# Patient Record
Sex: Female | Born: 1960
Health system: Southern US, Community
[De-identification: ages and names within clinical notes are randomized; demographics above are authoritative.]

## PROBLEM LIST (undated history)

## (undated) DIAGNOSIS — E119 Type 2 diabetes mellitus without complications: Secondary | ICD-10-CM

## (undated) DIAGNOSIS — Z8719 Personal history of other diseases of the digestive system: Secondary | ICD-10-CM

## (undated) DIAGNOSIS — Z8619 Personal history of other infectious and parasitic diseases: Secondary | ICD-10-CM

## (undated) DIAGNOSIS — E7800 Pure hypercholesterolemia, unspecified: Secondary | ICD-10-CM

## (undated) DIAGNOSIS — Z720 Tobacco use: Secondary | ICD-10-CM

## (undated) DIAGNOSIS — K219 Gastro-esophageal reflux disease without esophagitis: Secondary | ICD-10-CM

## (undated) DIAGNOSIS — E669 Obesity, unspecified: Secondary | ICD-10-CM

## (undated) DIAGNOSIS — M1990 Unspecified osteoarthritis, unspecified site: Secondary | ICD-10-CM

## (undated) DIAGNOSIS — I10 Essential (primary) hypertension: Secondary | ICD-10-CM

## (undated) HISTORY — PX: CHOLECYSTECTOMY: SHX55

## (undated) HISTORY — DX: Personal history of other infectious and parasitic diseases: Z86.19

## (undated) HISTORY — PX: ABDOMINAL HYSTERECTOMY: SHX81

## (undated) HISTORY — PX: APPENDECTOMY: SHX54

## (undated) HISTORY — PX: OTHER SURGICAL HISTORY: SHX169

---

## 2004-02-29 HISTORY — PX: CHOLECYSTECTOMY: SHX55

## 2004-02-29 HISTORY — PX: APPENDECTOMY: SHX54

## 2007-03-01 HISTORY — PX: ABDOMINAL HYSTERECTOMY: SHX81

## 2015-10-30 LAB — HM COLONOSCOPY

## 2015-11-29 LAB — HM COLONOSCOPY

## 2016-07-11 ENCOUNTER — Observation Stay (HOSPITAL_BASED_OUTPATIENT_CLINIC_OR_DEPARTMENT_OTHER)
Admission: EM | Admit: 2016-07-11 | Discharge: 2016-07-12 | Disposition: A | Discharge status: Home / Self Care | Payer: BLUE CROSS/BLUE SHIELD | Attending: Internal Medicine | Admitting: Internal Medicine

## 2016-07-11 ENCOUNTER — Encounter (HOSPITAL_BASED_OUTPATIENT_CLINIC_OR_DEPARTMENT_OTHER): Payer: Self-pay

## 2016-07-11 ENCOUNTER — Emergency Department (HOSPITAL_BASED_OUTPATIENT_CLINIC_OR_DEPARTMENT_OTHER): Payer: BLUE CROSS/BLUE SHIELD

## 2016-07-11 DIAGNOSIS — E7800 Pure hypercholesterolemia, unspecified: Secondary | ICD-10-CM | POA: Insufficient documentation

## 2016-07-11 DIAGNOSIS — I10 Essential (primary) hypertension: Secondary | ICD-10-CM | POA: Diagnosis present

## 2016-07-11 DIAGNOSIS — Z6834 Body mass index (BMI) 34.0-34.9, adult: Secondary | ICD-10-CM | POA: Diagnosis not present

## 2016-07-11 DIAGNOSIS — E1169 Type 2 diabetes mellitus with other specified complication: Secondary | ICD-10-CM

## 2016-07-11 DIAGNOSIS — E119 Type 2 diabetes mellitus without complications: Secondary | ICD-10-CM | POA: Diagnosis not present

## 2016-07-11 DIAGNOSIS — Z79899 Other long term (current) drug therapy: Secondary | ICD-10-CM | POA: Insufficient documentation

## 2016-07-11 DIAGNOSIS — R079 Chest pain, unspecified: Secondary | ICD-10-CM | POA: Diagnosis present

## 2016-07-11 DIAGNOSIS — Z794 Long term (current) use of insulin: Secondary | ICD-10-CM | POA: Diagnosis not present

## 2016-07-11 DIAGNOSIS — E669 Obesity, unspecified: Secondary | ICD-10-CM | POA: Diagnosis not present

## 2016-07-11 DIAGNOSIS — I200 Unstable angina: Principal | ICD-10-CM | POA: Insufficient documentation

## 2016-07-11 DIAGNOSIS — F17210 Nicotine dependence, cigarettes, uncomplicated: Secondary | ICD-10-CM | POA: Insufficient documentation

## 2016-07-11 DIAGNOSIS — Z7982 Long term (current) use of aspirin: Secondary | ICD-10-CM | POA: Diagnosis not present

## 2016-07-11 HISTORY — DX: Obesity, unspecified: E66.9

## 2016-07-11 HISTORY — DX: Pure hypercholesterolemia, unspecified: E78.00

## 2016-07-11 HISTORY — DX: Type 2 diabetes mellitus without complications: E11.9

## 2016-07-11 HISTORY — DX: Tobacco use: Z72.0

## 2016-07-11 HISTORY — DX: Essential (primary) hypertension: I10

## 2016-07-11 LAB — CBC WITH DIFFERENTIAL/PLATELET
BASOS ABS: 0 10*3/uL (ref 0.0–0.1)
Basophils Relative: 0 %
Eosinophils Absolute: 0.2 10*3/uL (ref 0.0–0.7)
Eosinophils Relative: 2 %
HEMATOCRIT: 42.5 % (ref 36.0–46.0)
HEMOGLOBIN: 14.2 g/dL (ref 12.0–15.0)
LYMPHS ABS: 2 10*3/uL (ref 0.7–4.0)
LYMPHS PCT: 30 %
MCH: 30.7 pg (ref 26.0–34.0)
MCHC: 33.4 g/dL (ref 30.0–36.0)
MCV: 91.8 fL (ref 78.0–100.0)
Monocytes Absolute: 0.4 10*3/uL (ref 0.1–1.0)
Monocytes Relative: 6 %
NEUTROS ABS: 4 10*3/uL (ref 1.7–7.7)
NEUTROS PCT: 62 %
PLATELETS: 225 10*3/uL (ref 150–400)
RBC: 4.63 MIL/uL (ref 3.87–5.11)
RDW: 14 % (ref 11.5–15.5)
WBC: 6.7 10*3/uL (ref 4.0–10.5)

## 2016-07-11 LAB — TROPONIN I

## 2016-07-11 LAB — COMPREHENSIVE METABOLIC PANEL
ALK PHOS: 158 U/L — AB (ref 38–126)
ALT: 34 U/L (ref 14–54)
AST: 72 U/L — AB (ref 15–41)
Albumin: 3.9 g/dL (ref 3.5–5.0)
Anion gap: 8 (ref 5–15)
BILIRUBIN TOTAL: 0.4 mg/dL (ref 0.3–1.2)
BUN: 11 mg/dL (ref 6–20)
CALCIUM: 9.2 mg/dL (ref 8.9–10.3)
CHLORIDE: 103 mmol/L (ref 101–111)
CO2: 26 mmol/L (ref 22–32)
CREATININE: 0.82 mg/dL (ref 0.44–1.00)
Glucose, Bld: 182 mg/dL — ABNORMAL HIGH (ref 65–99)
Potassium: 4.3 mmol/L (ref 3.5–5.1)
Sodium: 137 mmol/L (ref 135–145)
Total Protein: 7 g/dL (ref 6.5–8.1)

## 2016-07-11 LAB — GLUCOSE, CAPILLARY: Glucose-Capillary: 175 mg/dL — ABNORMAL HIGH (ref 65–99)

## 2016-07-11 MED ORDER — LABETALOL HCL 5 MG/ML IV SOLN: 5.0000 mg | INTRAVENOUS | Status: DC | PRN

## 2016-07-11 MED ORDER — ENOXAPARIN SODIUM 40 MG/0.4ML ~~LOC~~ SOLN
40.0000 mg | SUBCUTANEOUS | Status: DC
  Administered 2016-07-11: 40 mg via SUBCUTANEOUS
  Filled 2016-07-11: qty 0.4

## 2016-07-11 MED ORDER — ACETAMINOPHEN 325 MG PO TABS
650.0000 mg | ORAL_TABLET | ORAL | Status: DC | PRN
  Administered 2016-07-11 – 2016-07-12 (×3): 650 mg via ORAL
  Filled 2016-07-11 (×3): qty 2

## 2016-07-11 MED ORDER — NITROGLYCERIN 0.4 MG SL SUBL
0.4000 mg | SUBLINGUAL_TABLET | Freq: Once | SUBLINGUAL | Status: AC
  Administered 2016-07-11: 0.4 mg via SUBLINGUAL
  Filled 2016-07-11: qty 1

## 2016-07-11 MED ORDER — NITROGLYCERIN 0.4 MG SL SUBL: 0.4000 mg | SUBLINGUAL_TABLET | SUBLINGUAL | Status: DC | PRN

## 2016-07-11 MED ORDER — ASPIRIN 81 MG PO CHEW
81.0000 mg | CHEWABLE_TABLET | Freq: Every day | ORAL | Status: DC
  Administered 2016-07-12: 81 mg via ORAL
  Filled 2016-07-11: qty 1

## 2016-07-11 MED ORDER — ONDANSETRON HCL 4 MG/2ML IJ SOLN: 4.0000 mg | Freq: Four times a day (QID) | INTRAMUSCULAR | Status: DC | PRN

## 2016-07-11 MED ORDER — LISINOPRIL 10 MG PO TABS
10.0000 mg | ORAL_TABLET | Freq: Every day | ORAL | Status: DC
  Administered 2016-07-11: 10 mg via ORAL
  Filled 2016-07-11: qty 1

## 2016-07-11 MED ORDER — PANTOPRAZOLE SODIUM 40 MG PO TBEC
40.0000 mg | DELAYED_RELEASE_TABLET | Freq: Every day | ORAL | Status: DC
  Administered 2016-07-11 – 2016-07-12 (×2): 40 mg via ORAL
  Filled 2016-07-11 (×2): qty 1

## 2016-07-11 MED ORDER — ASPIRIN 81 MG PO CHEW
324.0000 mg | CHEWABLE_TABLET | Freq: Once | ORAL | Status: AC
  Administered 2016-07-11: 324 mg via ORAL
  Filled 2016-07-11: qty 4

## 2016-07-11 MED ORDER — INSULIN ASPART 100 UNIT/ML ~~LOC~~ SOLN
0.0000 [IU] | SUBCUTANEOUS | Status: DC
  Administered 2016-07-12: 3 [IU] via SUBCUTANEOUS

## 2016-07-11 MED ORDER — NITROGLYCERIN 0.4 MG SL SUBL
SUBLINGUAL_TABLET | SUBLINGUAL | Status: AC
  Administered 2016-07-11: 0.4 mg
  Filled 2016-07-11: qty 1

## 2016-07-11 NOTE — ED Provider Notes (Signed)
[facility] Provider Note   CSN: [id number] Arrival date & time: 07/11/16  1519  By signing my name below, I, Linna Darner, attest that this documentation has been prepared under the direction and in the presence of physician practitioner, Benjiman Core, MD. Electronically Signed: Linna Darner, Scribe. 07/11/2016. 3:39 PM.  History   Chief Complaint Chief Complaint  Patient presents with  . Chest Pain   The history is provided by the patient. No language interpreter was used.   HPI Comments: Patricia Shelton is a 56 y.o. female with PMHx of DM, HTN, and high cholesterol who presents to the Emergency Department complaining of sudden onset, intermittent, central chest pain beginning about 35 minutes ago while sitting. Patient reports some associated diaphoresis and states her chest pain radiates into her left shoulder. She states her pain is worse with respiration as well as applied pressure to her central chest. Patient works as a Designer, multimedia and denies any recent over-exertion. She has a h/o esophageal spasm and notes her current chest pain is different from that which she experienced with this condition. No h/o stress test or cardiac catheterization. She is a current smoker but is trying to quit. She denies nausea, vomiting, dyspnea, or any other associated symptoms.  Past Medical History:  Diagnosis Date  . Diabetes mellitus without complication (HCC)   . High cholesterol   . Hypertension     Patient Active Problem List   Diagnosis Date Noted  . Chest pain 07/11/2016  . HTN (hypertension) 07/11/2016  . DM2 (diabetes mellitus, type 2) (HCC) 07/11/2016    Past Surgical History:  Procedure Laterality Date  . ABDOMINAL HYSTERECTOMY    . APPENDECTOMY    . CHOLECYSTECTOMY      OB History    No data available       Home Medications    Prior to Admission medications   Medication Sig Start Date End Date Taking? Authorizing Provider  aspirin 81 MG chewable  tablet Chew by mouth daily.   Yes [provider]  ATORVASTATIN CALCIUM PO Take by mouth.   Yes [provider]  Insulin Glargine (TOUJEO SOLOSTAR [place]) Inject into the skin.   Yes [provider]  LISINOPRIL PO Take by mouth.   Yes [provider]  METFORMIN HCL PO Take by mouth.   Yes [provider]  OMEPRAZOLE PO Take by mouth.   Yes [provider]    Family History Family History  Problem Relation Age of Onset  . Other Mother        valvular heart disease from Rheumatic heart disease  . CAD Maternal Grandmother        CABG    Social History Social History  Substance Use Topics  . Smoking status: Current Every Day Smoker    Packs/day: 0.50    Years: 40.00    Types: Cigarettes  . Smokeless tobacco: Never Used     Comment: patient on chantix  . Alcohol use Yes     Comment: occ     Allergies   Sulfa antibiotics   Review of Systems Review of Systems  Constitutional: Positive for diaphoresis.  Respiratory: Negative for shortness of breath.   Cardiovascular: Positive for chest pain.  Gastrointestinal: Negative for nausea and vomiting.  Musculoskeletal: Positive for myalgias.   Physical Exam Updated Vital Signs BP (!) 188/98 (BP Location: Left Arm)   Pulse 72   Temp 97.9 F (36.6 C) (Oral)   Resp 20   Ht 5'  7" (1.702 m)   Wt 222 lb (100.7 kg)   SpO2 95%   BMI 34.77 kg/m   Physical Exam  Constitutional: She is oriented to person, place, and time. She appears well-developed and well-nourished.  Appears uncomfortable.  HENT:  Head: Normocephalic and atraumatic.  Eyes: Conjunctivae and EOM are normal.  Neck: Neck supple. No tracheal deviation present.  Cardiovascular: Normal rate.   Pulmonary/Chest: Effort normal and breath sounds normal. No respiratory distress. She has no wheezes. She has no rales.  Lower sternal chest tenderness.  Abdominal: There is no tenderness.  Musculoskeletal: Normal range of  motion.  No peripheral edema.  Neurological: She is alert and oriented to person, place, and time.  Skin: Skin is warm and dry.  Psychiatric: She has a normal mood and affect. Her behavior is normal.  Nursing note and vitals reviewed.  ED Treatments / Results  Labs (all labs ordered are listed, but only abnormal results are displayed) Labs Reviewed  COMPREHENSIVE METABOLIC PANEL - Abnormal; Notable for the following:       Result Value   Glucose, Bld 182 (*)    AST 72 (*)    Alkaline Phosphatase 158 (*)    All other components within normal limits  GLUCOSE, CAPILLARY - Abnormal; Notable for the following:    Glucose-Capillary 175 (*)    All other components within normal limits  CBC WITH DIFFERENTIAL/PLATELET  TROPONIN I  TROPONIN I  HIV ANTIBODY (ROUTINE TESTING)  TROPONIN I  TROPONIN I    EKG  EKG Interpretation  Date/Time:  Monday Jul 11 2016 15:23:08 EDT Ventricular Rate:  87 PR Interval:  138 QRS Duration: 88 QT Interval:  390 QTC Calculation: 469 R Axis:   -23 Text Interpretation:  Normal sinus rhythm Normal ECG Confirmed by Rubin PayorPICKERING  MD, Harrold DonathNATHAN [phone]([id number]) on 07/11/2016 3:30:35 PM       Radiology Dg Chest 2 View  Result Date: 07/11/2016 CLINICAL DATA:  Delay in x-ray due to an EKG being performed. Pt states that approx 45 minutes ago she began having sob with left sided chest pain and pain radiating down her left arm. Hx of htn, dm and smokes 1/2 pkpd. EXAM: CHEST  2 VIEW COMPARISON:  None. FINDINGS: Lateral view degraded by patient arm position. Midline trachea. Mild cardiomegaly. Mediastinal contours otherwise within normal limits. No pleural effusion or pneumothorax. Moderate pulmonary interstitial thickening. Numerous leads and wires project over the chest. No lobar consolidation. Mild volume loss at the left lung base. IMPRESSION: Cardiomegaly with moderate pulmonary interstitial thickening. Given the smoking history, this could simply represent the sequelae of  chronic bronchitis. Mild pulmonary venous congestion cannot be excluded. No overt congestive failure. Electronically Signed   By: Jeronimo GreavesKyle  Talbot M.D.   On: 07/11/2016 15:48    Procedures Procedures (including critical care time)  COORDINATION OF CARE: 3:37 PM Discussed treatment plan with pt at bedside and pt agreed to plan.  Medications Ordered in ED Medications  acetaminophen (TYLENOL) tablet 650 mg (650 mg Oral Given 07/11/16 2307)  ondansetron (ZOFRAN) injection 4 mg (not administered)  enoxaparin (LOVENOX) injection 40 mg (40 mg Subcutaneous Given 07/11/16 2308)  aspirin chewable tablet 81 mg (not administered)  insulin aspart (novoLOG) injection 0-9 Units (0 Units Subcutaneous Not Given 07/11/16 2312)  lisinopril (PRINIVIL,ZESTRIL) tablet 10 mg (10 mg Oral Given 07/11/16 2307)  labetalol (NORMODYNE,TRANDATE) injection 5 mg (not administered)  nitroGLYCERIN (NITROSTAT) SL tablet 0.4 mg (not administered)  pantoprazole (PROTONIX) EC tablet 40 mg (40 mg Oral  Given 07/11/16 2308)  nitroGLYCERIN (NITROSTAT) SL tablet 0.4 mg (0.4 mg Sublingual Given 07/11/16 1545)  aspirin chewable tablet 324 mg (324 mg Oral Given 07/11/16 1544)  nitroGLYCERIN (NITROSTAT) 0.4 MG SL tablet (0.4 mg  Given 07/11/16 2003)     Initial Impression / Assessment and Plan / ED Course  I have reviewed the triage vital signs and the nursing notes.  Pertinent labs & imaging results that were available during my care of the patient were reviewed by me and considered in my medical decision making (see chart for details).     Patient with chest pain. Feels better after nitroglycerin. History of esophageal spasm states this feels different. Does have risk factors. EKG reassuring. Will admit to internal medicine for further evaluation and treatment.  Final Clinical Impressions(s) / ED Diagnoses   Final diagnoses:  Nonspecific chest pain    New Prescriptions Current Discharge Medication List    I personally performed  the services described in this documentation, which was scribed in my presence. The recorded information has been reviewed and is accurate.      Benjiman Core, MD 07/11/16 [phone]

## 2016-07-11 NOTE — ED Triage Notes (Signed)
c/o CP x 30 min-NAD-presents to triage in w/c

## 2016-07-11 NOTE — ED Notes (Signed)
ED Provider at bedside. 

## 2016-07-11 NOTE — H&P (Signed)
History and Physical    Patricia Shelton ZOX:[id number] DOB: January 31, 1961 DOA: 07/11/2016  PCP: Noni Saupe, MD  Patient coming from: Home  I have personally briefly reviewed patient's old medical records in [facility]  Chief Complaint: Cheat pain  HPI: Patricia Shelton is a 56 y.o. female with medical history significant of DM2, HTN (not well controlled recently), HLD, current smoker.  Patient presents to the ED at [facility] today with c/o onset at 3pm of chest pain.  CP radiated to jaw, back, and L arm.  Pressure like sensation in central chest.  Different from esophageal spasms that she has had before.  No h/o stress test or heart cath in past.  No N/V, dyspnea.   ED Course: Trop neg, EKG nl.   Review of Systems: As per HPI otherwise 10 point review of systems negative.   Past Medical History:  Diagnosis Date  . Diabetes mellitus without complication (HCC)   . High cholesterol   . Hypertension     Past Surgical History:  Procedure Laterality Date  . ABDOMINAL HYSTERECTOMY    . APPENDECTOMY    . CHOLECYSTECTOMY       reports that she has been smoking Cigarettes.  She has a 20.00 pack-year smoking history. She has never used smokeless tobacco. She reports that she drinks alcohol. She reports that she does not use drugs.  Allergies  Allergen Reactions  . Sulfa Antibiotics Nausea And Vomiting    Family History  Problem Relation Age of Onset  . Other Mother        valvular heart disease from Rheumatic heart disease  . CAD Maternal Grandmother        CABG     Prior to Admission medications   Medication Sig Start Date End Date Taking? Authorizing Provider  aspirin 81 MG chewable tablet Chew by mouth daily.   Yes [provider]  ATORVASTATIN CALCIUM PO Take by mouth.   Yes [provider]  Insulin Glargine (TOUJEO SOLOSTAR [place]) Inject into the skin.   Yes [provider]  LISINOPRIL PO Take by mouth.   Yes [provider]  METFORMIN  HCL PO Take by mouth.   Yes [provider]  OMEPRAZOLE PO Take by mouth.   Yes [provider]    Physical Exam: Vitals:   07/11/16 1900 07/11/16 1930 07/11/16 2058 07/11/16 2103  BP: (!) 136/105 (!) 142/81 (!) 188/98   Pulse: 69 (!) 57 72   Resp: 12 (!) 21 20   Temp:   97.9 F (36.6 C)   TempSrc:   Oral   SpO2: 94% 92% 95%   Weight:   100.7 kg (222 lb) 100.7 kg (222 lb)  Height:    5\' 7"  (1.702 m)    Constitutional: NAD, calm, comfortable Eyes: PERRL, lids and conjunctivae normal ENMT: Mucous membranes are moist. Posterior pharynx clear of any exudate or lesions.Normal dentition.  Neck: normal, supple, no masses, no thyromegaly Respiratory: clear to auscultation bilaterally, no wheezing, no crackles. Normal respiratory effort. No accessory muscle use.  Cardiovascular: Regular rate and rhythm, no murmurs / rubs / gallops. No extremity edema. 2+ pedal pulses. No carotid bruits.  Abdomen: no tenderness, no masses palpated. No hepatosplenomegaly. Bowel sounds positive.  Musculoskeletal: no clubbing / cyanosis. No joint deformity upper and lower extremities. Good ROM, no contractures. Normal muscle tone.  Skin: no rashes, lesions, ulcers. No induration Neurologic: CN 2-12 grossly intact. Sensation intact, DTR normal. Strength 5/5 in all 4.  Psychiatric: Normal judgment and insight. Alert and oriented x 3. Normal mood.    Labs on Admission: I have personally reviewed following labs and imaging studies  CBC:  Recent Labs Lab 07/11/16 1535  WBC 6.7  NEUTROABS 4.0  HGB 14.2  HCT 42.5  MCV 91.8  PLT 225   Basic Metabolic Panel:  Recent Labs Lab 07/11/16 1535  NA 137  K 4.3  CL 103  CO2 26  GLUCOSE 182*  BUN 11  CREATININE 0.82  CALCIUM 9.2   GFR: Estimated Creatinine Clearance: 92.3 mL/min (by C-G formula based on SCr of 0.82 mg/dL). Liver Function Tests:  Recent Labs Lab 07/11/16 1535  AST 72*  ALT 34  ALKPHOS 158*  BILITOT 0.4  PROT  7.0  ALBUMIN 3.9   No results for input(s): LIPASE, AMYLASE in the last 168 hours. No results for input(s): AMMONIA in the last 168 hours. Coagulation Profile: No results for input(s): INR, PROTIME in the last 168 hours. Cardiac Enzymes:  Recent Labs Lab 07/11/16 1535  TROPONINI <0.03   BNP (last 3 results) No results for input(s): PROBNP in the last 8760 hours. HbA1C: No results for input(s): HGBA1C in the last 72 hours. CBG: No results for input(s): GLUCAP in the last 168 hours. Lipid Profile: No results for input(s): CHOL, HDL, LDLCALC, TRIG, CHOLHDL, LDLDIRECT in the last 72 hours. Thyroid Function Tests: No results for input(s): TSH, T4TOTAL, FREET4, T3FREE, THYROIDAB in the last 72 hours. Anemia Panel: No results for input(s): VITAMINB12, FOLATE, FERRITIN, TIBC, IRON, RETICCTPCT in the last 72 hours. Urine analysis: No results found for: COLORURINE, APPEARANCEUR, LABSPEC, PHURINE, GLUCOSEU, HGBUR, BILIRUBINUR, KETONESUR, PROTEINUR, UROBILINOGEN, NITRITE, LEUKOCYTESUR  Radiological Exams on Admission: Dg Chest 2 View  Result Date: 07/11/2016 CLINICAL DATA:  Delay in x-ray due to an EKG being performed. Pt states that approx 45 minutes ago she began having sob with left sided chest pain and pain radiating down her left arm. Hx of htn, dm and smokes 1/2 pkpd. EXAM: CHEST  2 VIEW COMPARISON:  None. FINDINGS: Lateral view degraded by patient arm position. Midline trachea. Mild cardiomegaly. Mediastinal contours otherwise within normal limits. No pleural effusion or pneumothorax. Moderate pulmonary interstitial thickening. Numerous leads and wires project over the chest. No lobar consolidation. Mild volume loss at the left lung base. IMPRESSION: Cardiomegaly with moderate pulmonary interstitial thickening. Given the smoking history, this could simply represent the sequelae of chronic bronchitis. Mild pulmonary venous congestion cannot be excluded. No overt congestive failure.  Electronically Signed   By: Jeronimo GreavesKyle  Talbot M.D.   On: 07/11/2016 15:48    EKG: Independently reviewed.  Assessment/Plan Principal Problem:   Chest pain Active Problems:   HTN (hypertension)   DM2 (diabetes mellitus, type 2) (HCC)    1. Chest pain - 1. HEART score of 5 2. CP obs pathway 3. Serial trops 4. NPO after midnight 5. Tele monitor 6. Call cards in AM re: stress test 2. HTN - 1. continue lisinopril 2. add PRN labetalol 3. DM2 - 1. hold home metformin and lantus 2. sensitive scale SSI q4h while NPO  DVT prophylaxis: Lovenox Code Status: Full Family Communication: No family in room Disposition Plan: Home after admission Consults called: None Admission status: Place in obs   Hillary BowGARDNER, Cherron Blitzer M. DO [facility] Pager [phone]  If 7AM-7PM, please contact day team taking care of patient [email or web address] Password [email or web address]  07/11/2016, 9:50 PM

## 2016-07-11 NOTE — ED Notes (Signed)
Report given to [facility] 

## 2016-07-11 NOTE — ED Notes (Signed)
Report given to Donna RN

## 2016-07-11 NOTE — ED Notes (Signed)
Thomas w/ [facility] stated that it'll be after 7pm before a truck will arrive for transport.  

## 2016-07-11 NOTE — Plan of Care (Signed)
Called by [facility] for patient many Patricia Shelton, 56 year old female with diabetes, hypertension, hyperlipidemia with history of esophageal spasm in the past presented with chest pain, radiating to left shoulder Troponins 1 negative, EKG negative for any acute ST-T wave changes BP 162/98  Accepted for chest pain rule out, telemetry observation. No prior cardiac workup   Virginio Isidore M.D. [facility] 07/11/2016, 5:22 PM  Pager: [phone]

## 2016-07-11 NOTE — ED Notes (Signed)
Patient transported to X-ray 

## 2016-07-11 NOTE — ED Notes (Signed)
Attempt to  call report , nurse not available 

## 2016-07-12 ENCOUNTER — Encounter (HOSPITAL_COMMUNITY): Payer: Self-pay

## 2016-07-12 ENCOUNTER — Observation Stay (HOSPITAL_BASED_OUTPATIENT_CLINIC_OR_DEPARTMENT_OTHER): Payer: BLUE CROSS/BLUE SHIELD

## 2016-07-12 DIAGNOSIS — E119 Type 2 diabetes mellitus without complications: Secondary | ICD-10-CM | POA: Diagnosis not present

## 2016-07-12 DIAGNOSIS — R079 Chest pain, unspecified: Secondary | ICD-10-CM

## 2016-07-12 DIAGNOSIS — Z794 Long term (current) use of insulin: Secondary | ICD-10-CM | POA: Diagnosis not present

## 2016-07-12 DIAGNOSIS — I10 Essential (primary) hypertension: Secondary | ICD-10-CM

## 2016-07-12 LAB — NM MYOCAR MULTI W/SPECT W/WALL MOTION / EF
CHL CUP MPHR: 163 {beats}/min
CHL CUP RESTING HR STRESS: 73 {beats}/min
CSEPED: 0 min
CSEPEDS: 0 s
Estimated workload: 1 METS
Peak HR: 104 {beats}/min
Percent HR: 63 %

## 2016-07-12 LAB — TROPONIN I: Troponin I: <0.03 ng/mL (ref <0.03)

## 2016-07-12 LAB — GLUCOSE, CAPILLARY
GLUCOSE-CAPILLARY: 219 mg/dL — AB (ref 65–99)
Glucose-Capillary: 143 mg/dL — ABNORMAL HIGH (ref 65–99)
Glucose-Capillary: 157 mg/dL — ABNORMAL HIGH (ref 65–99)
Glucose-Capillary: 158 mg/dL — ABNORMAL HIGH (ref 65–99)
Glucose-Capillary: 213 mg/dL — ABNORMAL HIGH (ref 65–99)

## 2016-07-12 LAB — HIV ANTIBODY (ROUTINE TESTING W REFLEX): HIV SCREEN 4TH GENERATION: NONREACTIVE

## 2016-07-12 MED ORDER — LISINOPRIL 40 MG PO TABS: 40.0000 mg | ORAL_TABLET | Freq: Every day | ORAL | 0 refills | Status: DC

## 2016-07-12 MED ORDER — LISINOPRIL 10 MG PO TABS
20.0000 mg | ORAL_TABLET | Freq: Every day | ORAL | Status: DC
Start: 2016-07-12 — End: 2016-07-12

## 2016-07-12 MED ORDER — LISINOPRIL 40 MG PO TABS: 40.0000 mg | ORAL_TABLET | Freq: Every day | ORAL | Status: DC

## 2016-07-12 MED ORDER — REGADENOSON 0.4 MG/5ML IV SOLN
INTRAVENOUS | Status: AC
  Filled 2016-07-12: qty 5

## 2016-07-12 MED ORDER — REGADENOSON 0.4 MG/5ML IV SOLN
0.4000 mg | Freq: Once | INTRAVENOUS | Status: AC
  Administered 2016-07-12: 0.4 mg via INTRAVENOUS

## 2016-07-12 MED ORDER — TECHNETIUM TC 99M TETROFOSMIN IV KIT
10.0000 | PACK | Freq: Once | INTRAVENOUS | Status: AC | PRN
  Administered 2016-07-12: 10 via INTRAVENOUS

## 2016-07-12 MED ORDER — TECHNETIUM TC 99M TETROFOSMIN IV KIT
30.0000 | PACK | Freq: Once | INTRAVENOUS | Status: AC | PRN
  Administered 2016-07-12: 30 via INTRAVENOUS

## 2016-07-12 NOTE — Discharge Summary (Signed)
Physician Discharge Summary  Patricia Shelton ZOX:[id number] DOB: 08-Nov-1960 DOA: 07/11/2016  PCP: Noni Saupe, MD  Admit date: 07/11/2016 Discharge date: 07/12/2016   Recommendations for Outpatient Follow-Up:   1. Titration of BP meds 2. Smoking cessation   Discharge Diagnosis:   Principal Problem:   Chest pain Active Problems:   HTN (hypertension)   DM2 (diabetes mellitus, type 2) (HCC)   Discharge disposition:  Home.  Discharge Condition: Improved.  Diet recommendation: Low sodium, heart healthy.  Carbohydrate-modified  Wound care: None.   History of Present Illness:   Patricia Shelton is a 56 y.o. female with medical history significant of DM2, HTN (not well controlled recently), HLD, current smoker.  Patient presents to the ED at [facility] today with c/o onset at 3pm of chest pain.  CP radiated to jaw, back, and L arm.  Pressure like sensation in central chest.  Different from esophageal spasms that she has had before.  No h/o stress test or heart cath in past.  No N/V, dyspnea.   Hospital Course by Problem:   Unstable angina:   -on asa.   -lipitor @ home -low risk stress test  Essential HTN:  BP trending 150's to 170's this am.  Titrate lisinopril to 40.  May need additional agent.  Would consider chlorthalidone   HL:  Resume statin.   DMII:  resume.  Obesity:  Would benefit from outpt DM ed/nutrition counseling.  Tob Abuse:  Now using chantix. Complete cessation advised.     Medical Consultants:    cards   Discharge Exam:   Vitals:   07/12/16 1453 07/12/16 1455  BP: (!) 142/85 (!) 141/87  Pulse: (!) 102 91  Resp:    Temp:     Vitals:   07/12/16 1435 07/12/16 1451 07/12/16 1453 07/12/16 1455  BP: (!) 174/99 (!) 163/84 (!) 142/85 (!) 141/87  Pulse: 65 76 (!) 102 91  Resp:      Temp:      TempSrc:      SpO2:      Weight:      Height:        Gen:  NAD    The results of significant diagnostics from this hospitalization (including  imaging, microbiology, ancillary and laboratory) are listed below for reference.     Procedures and Diagnostic Studies:   Dg Chest 2 View  Result Date: 07/11/2016 CLINICAL DATA:  Delay in x-ray due to an EKG being performed. Pt states that approx 45 minutes ago she began having sob with left sided chest pain and pain radiating down her left arm. Hx of htn, dm and smokes 1/2 pkpd. EXAM: CHEST  2 VIEW COMPARISON:  None. FINDINGS: Lateral view degraded by patient arm position. Midline trachea. Mild cardiomegaly. Mediastinal contours otherwise within normal limits. No pleural effusion or pneumothorax. Moderate pulmonary interstitial thickening. Numerous leads and wires project over the chest. No lobar consolidation. Mild volume loss at the left lung base. IMPRESSION: Cardiomegaly with moderate pulmonary interstitial thickening. Given the smoking history, this could simply represent the sequelae of chronic bronchitis. Mild pulmonary venous congestion cannot be excluded. No overt congestive failure. Electronically Signed   By: Jeronimo Greaves M.D.   On: 07/11/2016 15:48     Labs:   Basic Metabolic Panel:  Recent Labs Lab 07/11/16 1535  NA 137  K 4.3  CL 103  CO2 26  GLUCOSE 182*  BUN 11  CREATININE 0.82  CALCIUM 9.2   GFR Estimated Creatinine Clearance: 92.3  mL/min (by C-G formula based on SCr of 0.82 mg/dL). Liver Function Tests:  Recent Labs Lab 07/11/16 1535  AST 72*  ALT 34  ALKPHOS 158*  BILITOT 0.4  PROT 7.0  ALBUMIN 3.9   No results for input(s): LIPASE, AMYLASE in the last 168 hours. No results for input(s): AMMONIA in the last 168 hours. Coagulation profile No results for input(s): INR, PROTIME in the last 168 hours.  CBC:  Recent Labs Lab 07/11/16 1535  WBC 6.7  NEUTROABS 4.0  HGB 14.2  HCT 42.5  MCV 91.8  PLT 225   Cardiac Enzymes:  Recent Labs Lab 07/11/16 1535 07/11/16 2142 07/12/16 0033 07/12/16 0312  TROPONINI <0.03 <0.03 <0.03 <0.03    BNP: Invalid input(s): POCBNP CBG:  Recent Labs Lab 07/11/16 2052 07/12/16 0006 07/12/16 0433 07/12/16 0816 07/12/16 1135  GLUCAP 175* 219* 143* 157* 158*   D-Dimer No results for input(s): DDIMER in the last 72 hours. Hgb A1c No results for input(s): HGBA1C in the last 72 hours. Lipid Profile No results for input(s): CHOL, HDL, LDLCALC, TRIG, CHOLHDL, LDLDIRECT in the last 72 hours. Thyroid function studies No results for input(s): TSH, T4TOTAL, T3FREE, THYROIDAB in the last 72 hours.  Invalid input(s): FREET3 Anemia work up No results for input(s): VITAMINB12, FOLATE, FERRITIN, TIBC, IRON, RETICCTPCT in the last 72 hours. Microbiology No results found for this or any previous visit (from the past 240 hour(s)).   Discharge Instructions:    Allergies as of 07/12/2016      Reactions   Sulfa Antibiotics Nausea And Vomiting      Medication List    TAKE these medications   aspirin 81 MG chewable tablet Chew 81 mg by mouth daily.   atorvastatin 10 MG tablet Commonly known as:  LIPITOR Take 10 mg by mouth at bedtime.   CHANTIX CONTINUING MONTH PAK 1 MG tablet Generic drug:  varenicline Take 1 mg by mouth 2 (two) times daily.   lisinopril 40 MG tablet Commonly known as:  PRINIVIL,ZESTRIL Take 1 tablet (40 mg total) by mouth daily. Start taking on:  07/13/2016 What changed:  medication strength  how much to take  when to take this   metFORMIN 500 MG 24 hr tablet Commonly known as:  GLUCOPHAGE-XR Take 2,000 mg by mouth daily.   omeprazole 40 MG capsule Commonly known as:  PRILOSEC Take 40 mg by mouth daily.   TOUJEO SOLOSTAR 300 UNIT/ML Sopn Generic drug:  Insulin Glargine Inject 10 Units into the skin at bedtime.      Follow-up Information    Noni Saupeedding, John F. II, MD Follow up in 1 week(s).   Specialty:  Family Medicine Contact information: [address] [place] [address] [phone]            Time coordinating discharge:  25 min  Signed:  Amberlynn Tempesta Juanetta GoslingU Lillionna Nabi   [facility] 07/12/2016, 3:57 PM

## 2016-07-12 NOTE — Progress Notes (Signed)
   Sheralyn BoatmanMary Marlin presented for a lexiscan cardiolite today.  No immediate complications.  Stress imaging is pending at this time.  Nicolasa Duckinghristopher Melissia Lahman, NP 07/12/2016, 2:46 PM

## 2016-07-12 NOTE — Consult Note (Signed)
Cardiology Consult    Patient ID: Patricia BoatmanMary Massey MRN: [id number], DOB/AGE: 09/28/1960   Admit date: 07/11/2016 Date of Consult: 07/12/2016  Primary Physician: Noni Saupeedding, John F. II, MD Primary Cardiologist: New - seen by Katherina RightP. Jerral Mccauley, MD  Requesting Provider: Verner MouldJ. Vann, MD  Patient Profile    Patricia Shelton is a 56 y.o. female with a history of HTN, obesity, DM, tob abuse, and HL, who is being seen today for the evaluation of chest pain at the request of Dr. Benjamine MolaVann.  Past Medical History   Past Medical History:  Diagnosis Date  . Diabetes mellitus without complication (HCC)   . High cholesterol   . Hypertension   . Obesity   . Tobacco abuse     Past Surgical History:  Procedure Laterality Date  . ABDOMINAL HYSTERECTOMY    . APPENDECTOMY    . CHOLECYSTECTOMY      Allergies  Allergies  Allergen Reactions  . Sulfa Antibiotics Nausea And Vomiting    History of Present Illness    56 y/o ? with a h/o HTN, DM, HL, tob abuse, and obesity.  She lives locally and exercises 1-2 days/wk.  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  After smoking 1.5 ppd for the past 40 yrs, she recently started chantix and has cut back to 0.5 ppd.  She was in her usoh until the afternoon of 5/14, when she developed 5/10 sscp/tightness with bilateral scapular squeezing pain, radiating to her left arm and jaw, assoc with dyspnea, diaphoresis, and nausea.  After ~ 1 hr of ongoing Ss, she presented to the ED where ECG was non-acute.  Troponin was nl.  She was treated with ntg sl x 2 @ 15:45 w/o immediate relief.  C/p finally resolved some time around 6 pm.  She did have a brief recurrence around 8pm, which again resolved with sl ntg.  She has not had c/p since last night and troponins have remained nl.  We have been asked to eval.  Inpatient Medications    . aspirin  81 mg Oral Daily  . enoxaparin (LOVENOX) injection  40 mg Subcutaneous Q24H  . insulin aspart  0-9 Units Subcutaneous Q4H    . lisinopril  20 mg Oral Daily  . pantoprazole  40 mg Oral Daily    Family History    Family History  Problem Relation Age of Onset  . Other Mother        valvular heart disease from Rheumatic heart disease - s/p bioprosthetic mvr/avr  . CAD Maternal Grandmother        CABG    Social History    Social History   Social History  . Marital status: Married    Spouse name: N/A  . Number of children: N/A  . Years of education: N/A   Occupational History  . Not on file.   Social History Main Topics  . Smoking status: Current Every Day Smoker    Packs/day: 0.50    Years: 40.00    Types: Cigarettes  . Smokeless tobacco: Never Used     Comment: smoked 1.5 ppd for ~ 40 yrs - recently cut down to 1/2 ppd.  . Alcohol use Yes     Comment: occasional drink - 1 or less/wk  . Drug use: No  . Sexual activity: Not on file   Other Topics Concern  . Not on file   Social History Narrative  . No narrative on file  Review of Systems    General:  No chills, fever, night sweats or weight changes. +++ diaphoresis in setting of c/p. Cardiovascular:  +++ chest pain, +++ dyspnea in setting of c/p.  No recent change in exercise tolerance, edema, orthopnea, palpitations, paroxysmal nocturnal dyspnea. Dermatological: No rash, lesions/masses Respiratory: No cough, dyspnea Urologic: No hematuria, dysuria Abdominal:   +++ nausea in setting of c/p, no vomiting, diarrhea, bright red blood per rectum, melena, or hematemesis Neurologic:  No visual changes, wkns, changes in mental status. All other systems reviewed and are otherwise negative except as noted above.  Physical Exam    Blood pressure (!) 164/98, pulse 75, temperature 98 F (36.7 C), temperature source Oral, resp. rate 20, height 5\' 7"  (1.702 m), weight 222 lb (100.7 kg), SpO2 98 %.  General: Pleasant, NAD Psych: Normal affect. Neuro: Alert and oriented X 3. Moves all extremities spontaneously. HEENT: Normal  Neck: Supple  without bruits or JVD. Lungs:  Resp regular and unlabored, CTA. Heart: RRR no s3, s4, or murmurs. Abdomen: Soft, non-tender, non-distended, BS + x 4.  Extremities: No clubbing, cyanosis or edema. DP/PT/Radials 2+ and equal bilaterally.  Labs     Recent Labs  07/11/16 1535 07/11/16 2142 07/12/16 0033 07/12/16 0312  TROPONINI <0.03 <0.03 <0.03 <0.03   Lab Results  Component Value Date   WBC 6.7 07/11/2016   HGB 14.2 07/11/2016   HCT 42.5 07/11/2016   MCV 91.8 07/11/2016   PLT 225 07/11/2016     Recent Labs Lab 07/11/16 1535  NA 137  K 4.3  CL 103  CO2 26  BUN 11  CREATININE 0.82  CALCIUM 9.2  PROT 7.0  BILITOT 0.4  ALKPHOS 158*  ALT 34  AST 72*  GLUCOSE 182*   Radiology Studies    Dg Chest 2 View  Result Date: 07/11/2016 CLINICAL DATA:  Delay in x-ray due to an EKG being performed. Pt states that approx 45 minutes ago she began having sob with left sided chest pain and pain radiating down her left arm. Hx of htn, dm and smokes 1/2 pkpd. EXAM: CHEST  2 VIEW COMPARISON:  None. FINDINGS: Lateral view degraded by patient arm position. Midline trachea. Mild cardiomegaly. Mediastinal contours otherwise within normal limits. No pleural effusion or pneumothorax. Moderate pulmonary interstitial thickening. Numerous leads and wires project over the chest. No lobar consolidation. Mild volume loss at the left lung base. IMPRESSION: Cardiomegaly with moderate pulmonary interstitial thickening. Given the smoking history, this could simply represent the sequelae of chronic bronchitis. Mild pulmonary venous congestion cannot be excluded. No overt congestive failure. Electronically Signed   By: Jeronimo Greaves M.D.   On: 07/11/2016 15:48    ECG & Cardiac Imaging    RSR, 82, leftward axis, borderline LAE, no acute st/t changes.  Assessment & Plan    1.  Unstable angina:  Pt w/o prior cardiac hx with RF of HTN, HL, obesity, and tob abuse, presented to the ED on 5/14 following  development of sscp and bilat scapular pain and squeezing, radiating down the left arm and to jaw, assoc with diaphoresis, n, and dyspnea.  Ss lasted ~ 3.5 hrs prior to complete resolution after receiving sl NTG.  Despite prolonged Ss, ECG is non-acute and troponins are normal.  She has been pain free since last night.  I have arranged for a lexiscan myoview this afternoon.  Provided that this is low risk, she could likely be discharged home later today.  If stress testing reveals ischemia,  she will require cath.  Cont asa.  Will add statin (appears to have been on lipitor @ home).    2.  Essential HTN:  BP trending 150's to 170's this am.  Titrate lisinopril to 40.  May need additional agent.  Would consider chlorthalidone or if st test +  coreg.  3.  HL:  Resume statin.  4.  DMII:  Metformin on hold.  Per IM.  5.  Obesity:  Would benefit from outpt DM ed/nutrition counseling.  6.  Tob Abuse:  Now using chantix. Complete cessation advised.  Signed, Nicolasa Ducking, NP 07/12/2016, 11:45 AM  Attending Note:   The patient was seen and examined.  Agree with assessment and plan as noted above.  Changes made to the above note as needed.  Patient seen and independently examined with Ward Givens, PA.   We discussed all aspects of the encounter. I agree with the assessment and plan as stated above.  1. Chest pain :  Has some worrisome characteristics. But despite prolonged chest pain , her troponins are negative . Hx of DM. ECG is unremarkable   Will get a stress myoview today  Anticipate DC later if the myoview is low risk .     I have spent a total of 40 minutes with patient reviewing hospital  notes , telemetry, EKGs, labs and examining patient as well as establishing an assessment and plan that was discussed with the patient. > 50% of time was spent in direct patient care.    Vesta Mixer, Montez Hageman., MD, [facility] 07/12/2016, 12:17 PM [address],  [address] Office [phone] Pager [phone]

## 2016-07-28 ENCOUNTER — Telehealth: Payer: Self-pay | Admitting: Behavioral Health

## 2016-07-28 NOTE — Telephone Encounter (Signed)
Attempted to reach patient for Pre-visit call. Per recording, the call cannot be completed as dial. No other number available on file.

## 2016-07-29 ENCOUNTER — Ambulatory Visit (INDEPENDENT_AMBULATORY_CARE_PROVIDER_SITE_OTHER): Payer: BLUE CROSS/BLUE SHIELD | Admitting: Family Medicine

## 2016-07-29 ENCOUNTER — Encounter: Payer: Self-pay | Admitting: Family Medicine

## 2016-07-29 VITALS — BP 112/82 | HR 101 | Temp 98.3°F | Ht 65.5 in | Wt 222.4 lb

## 2016-07-29 DIAGNOSIS — E119 Type 2 diabetes mellitus without complications: Secondary | ICD-10-CM

## 2016-07-29 DIAGNOSIS — I10 Essential (primary) hypertension: Secondary | ICD-10-CM | POA: Diagnosis not present

## 2016-07-29 DIAGNOSIS — Z794 Long term (current) use of insulin: Secondary | ICD-10-CM

## 2016-07-29 MED ORDER — LISINOPRIL 40 MG PO TABS: 40.0000 mg | ORAL_TABLET | Freq: Every day | ORAL | 1 refills | Status: DC

## 2016-07-29 MED ORDER — HYDROCHLOROTHIAZIDE 25 MG PO TABS: 25.0000 mg | ORAL_TABLET | Freq: Every day | ORAL | 1 refills | Status: DC

## 2016-07-29 MED ORDER — INSULIN GLARGINE 300 UNIT/ML ~~LOC~~ SOPN: 10.0000 [IU] | PEN_INJECTOR | Freq: Every day | SUBCUTANEOUS | 6 refills | Status: DC

## 2016-07-29 MED ORDER — ATORVASTATIN CALCIUM 20 MG PO TABS: 20.0000 mg | ORAL_TABLET | Freq: Every day | ORAL | 2 refills | Status: DC

## 2016-07-29 MED ORDER — CHANTIX CONTINUING MONTH PAK 1 MG PO TABS: 1.0000 mg | ORAL_TABLET | Freq: Two times a day (BID) | ORAL | 0 refills | Status: DC

## 2016-07-29 NOTE — Patient Instructions (Addendum)
Healthy Eating Plan Many factors influence your heart health, including eating and exercise habits. Heart (coronary) risk increases with abnormal blood fat (lipid) levels. Heart-healthy meal planning includes limiting unhealthy fats, increasing healthy fats, and making other small dietary changes. This includes maintaining a healthy body weight to help keep lipid levels within a normal range.  WHAT IS MY PLAN?  Your health care provider recommends that you:  Drink a glass of water before meals to help with satiety.  Eat slowly.  An alternative to the water is to add Metamucil. This will help with satiety as well. It does contain calories, unlike water.  WHAT TYPES OF FAT SHOULD I CHOOSE?  Choose healthy fats more often. Choose monounsaturated and polyunsaturated fats, such as olive oil and canola oil, flaxseeds, walnuts, almonds, and seeds.  Eat more omega-3 fats. Good choices include salmon, mackerel, sardines, tuna, flaxseed oil, and ground flaxseeds. Aim to eat fish at least two times each week.  Avoid foods with partially hydrogenated oils in them. These contain trans fats. Examples of foods that contain trans fats are stick margarine, some tub margarines, cookies, crackers, and other baked goods. If you are going to avoid a fat, this is the one to avoid!  WHAT GENERAL GUIDELINES DO I NEED TO FOLLOW?  Check food labels carefully to identify foods with trans fats. Avoid these types of options when possible.  Fill one half of your plate with vegetables and green salads. Eat 4-5 servings of vegetables per day. A serving of vegetables equals 1 cup of raw leafy vegetables,  cup of raw or cooked cut-up vegetables, or  cup of vegetable juice.  Fill one fourth of your plate with whole grains. Look for the word "whole" as the first word in the ingredient list.  Fill one fourth of your plate with lean protein foods.  Eat 4-5 servings of fruit per day. A serving of fruit equals one medium  whole fruit,  cup of dried fruit,  cup of fresh, frozen, or canned fruit. Try to avoid fruits in cups/syrups as the sugar content can be high.  Eat more foods that contain soluble fiber. Examples of foods that contain this type of fiber are apples, broccoli, carrots, beans, peas, and barley. Aim to get 20-30 g of fiber per day.  Eat more home-cooked food and less restaurant, buffet, and fast food.  Limit or avoid alcohol.  Limit foods that are high in starch and sugar.  Avoid fried foods when able.  Cook foods by using methods other than frying. Baking, boiling, grilling, and broiling are all great options. Other fat-reducing suggestions include: ? Removing the skin from poultry. ? Removing all visible fats from meats. ? Skimming the fat off of stews, soups, and gravies before serving them. ? Steaming vegetables in water or broth.  Lose weight if you are overweight. Losing just 5-10% of your initial body weight can help your overall health and prevent diseases such as diabetes and heart disease.  Increase your consumption of nuts, legumes, and seeds to 4-5 servings per week. One serving of dried beans or legumes equals  cup after being cooked, one serving of nuts equals 1 ounces, and one serving of seeds equals  ounce or 1 tablespoon.  WHAT ARE GOOD FOODS CAN I EAT? Grains Grainy breads (try to find bread that is 3 g of fiber per slice or greater), oatmeal, light popcorn. Whole-grain cereals. Rice and pasta, including brown rice and those that are made with whole wheat.   Edamame pasta is a great alternative to grain pasta. It has a higher protein content. Try to avoid significant consumption of white bread, sugary cereals, or pastries/baked goods.  Vegetables All vegetables. Cooked white potatoes do not count as vegetables.  Fruits All fruits, but limit pineapple and bananas as these fruits have a higher sugar content.  Meats and Other Protein Sources Lean, well-trimmed beef,  veal, pork, and lamb. Chicken and [place] without skin. All fish and shellfish. Wild duck, rabbit, pheasant, and venison. Egg whites or low-cholesterol egg substitutes. Dried beans, peas, lentils, and tofu.Seeds and most nuts.  Dairy Low-fat or nonfat cheeses, including ricotta, string, and mozzarella. Skim or 1% milk that is liquid, powdered, or evaporated. Buttermilk that is made with low-fat milk. Nonfat or low-fat yogurt. Soy/Almond milk are good alternatives if you cannot handle dairy.  Beverages Water is the best for you. Sports drinks with less sugar are more desirable unless you are a highly active athlete.  Sweets and Desserts Sherbets and fruit ices. Honey, jam, marmalade, jelly, and syrups. Dark chocolate.  Eat all sweets and desserts in moderation.  Fats and Oils Nonhydrogenated (trans-free) margarines. Vegetable oils, including soybean, sesame, sunflower, olive, peanut, safflower, corn, canola, and cottonseed. Salad dressings or mayonnaise that are made with a vegetable oil. Limit added fats and oils that you use for cooking, baking, salads, and as spreads.  Other Cocoa powder. Coffee and tea. Most condiments.  The items listed above may not be a complete list of recommended foods or beverages. Contact your dietitian for more options.  Take 2 tabs of Lipitor until you run out.

## 2016-07-29 NOTE — Progress Notes (Signed)
Chief Complaint  Patient presents with  . Establish Care    pt for a hosp f/u for elevated BP       New Patient Visit SUBJECTIVE: HPI: Patricia Shelton is an 56 y.o.female who is being seen for establishing care. Here with husband.   The patient was previously seen at Dr Redding's office. Pt was disgruntled with office.  The patient was recently admitted to [facility] for chest pain rule out. She has a history of hypertension, dyslipidemia, diabetes. Her blood pressures fluctuate until her lisinopril was increased to 40 mg daily. Since that time, her blood pressure has been running in the 100's-110's/70's. She is also on HCTZ 25 mg daily. DM checked and was 8 in early May. Diet could be better. Does not exercise routinely.    Allergies  Allergen Reactions  . Sulfa Antibiotics Nausea And Vomiting    Past Medical History:  Diagnosis Date  . Diabetes mellitus without complication (HCC)   . High cholesterol   . History of chicken pox   . Hypertension   . Obesity   . Tobacco abuse    Past Surgical History:  Procedure Laterality Date  . ABDOMINAL HYSTERECTOMY    . APPENDECTOMY    . CHOLECYSTECTOMY     Social History   Social History  . Marital status: Married   Social History Main Topics  . Smoking status: Current Every Day Smoker    Packs/day: 0.50    Years: 40.00    Types: Cigarettes  . Smokeless tobacco: Never Used     Comment: smoked 1.5 ppd for ~ 40 yrs - recently cut down to 1/2 ppd.  . Alcohol use Yes     Comment: occasional drink - 1 or less/wk  . Drug use: No   Family History  Problem Relation Age of Onset  . Other Mother        valvular heart disease from Rheumatic heart disease - s/p bioprosthetic mvr/avr  . Cancer Mother        Breast  . CAD Maternal Grandmother        CABG  . Diabetes Maternal Grandfather      Current Outpatient Prescriptions:  .  aspirin 81 MG chewable tablet, Chew 81 mg by mouth daily. , Disp: , Rfl:  .  BD PEN NEEDLE NANO  U/F 32G X 4 MM MISC, As directed, Disp: , Rfl: 11 .  CHANTIX CONTINUING MONTH PAK 1 MG tablet, Take 1 tablet (1 mg total) by mouth 2 (two) times daily., Disp: 60 tablet, Rfl: 0 .  hydrochlorothiazide (HYDRODIURIL) 25 MG tablet, Take 1 tablet (25 mg total) by mouth daily., Disp: 90 tablet, Rfl: 1 .  Insulin Glargine (TOUJEO SOLOSTAR) 300 UNIT/ML SOPN, Inject 10 Units into the skin at bedtime., Disp: 3 mL, Rfl: 6 .  lisinopril (PRINIVIL,ZESTRIL) 40 MG tablet, Take 1 tablet (40 mg total) by mouth daily., Disp: 90 tablet, Rfl: 1 .  metFORMIN (GLUCOPHAGE-XR) 500 MG 24 hr tablet, Take 2 tablets (1,000 mg total) by mouth 2 (two) times daily., Disp: , Rfl:  .  omeprazole (PRILOSEC) 40 MG capsule, Take 40 mg by mouth daily. , Disp: , Rfl:  .  atorvastatin (LIPITOR) 20 MG tablet, Take 1 tablet (20 mg total) by mouth daily., Disp: 30 tablet, Rfl: 2  No LMP recorded. Patient has had a hysterectomy.  ROS Cardiovascular: Denies chest pain  Respiratory: Denies dyspnea   OBJECTIVE: BP 112/82 (BP Location: Left Arm, Patient Position: Sitting, Cuff Size:  Large)   Pulse (!) 101   Temp 98.3 F (36.8 C) (Oral)   Ht 5' 5.5" (1.664 m)   Wt 222 lb 6.4 oz (100.9 kg)   SpO2 97%   BMI 36.45 kg/m   Constitutional: -  VS reviewed -  Well developed, well nourished, appears stated age -  No apparent distress  Psychiatric: -  Oriented to person, place, and time -  Memory intact -  Affect and mood normal -  Fluent conversation, good eye contact -  Judgment and insight age appropriate  Eye: -  Conjunctivae clear, no discharge -  Pupils symmetric, round, reactive to light  ENMT: -  Nares patent, no D/C -  Oral mucosa without lesions, tongue and uvula midline    Tonsils not enlarged, no erythema, no exudate, trachea midline    Pharynx moist, no lesions, no erythema  Neck: -  No gross swelling, no palpable masses -  Thyroid midline, not enlarged, mobile, no palpable masses  Cardiovascular: -  RRR, no murmurs -   No LE edema  Respiratory: -  Normal respiratory effort, no accessory muscle use, no retraction -  Breath sounds equal, no wheezes, no ronchi, no crackles  Musculoskeletal: -  No clubbing, no cyanosis -  Gait normal  Skin: -  No significant lesion on inspection -  Warm and dry to palpation   ASSESSMENT/PLAN: Essential hypertension - Plan: hydrochlorothiazide (HYDRODIURIL) 25 MG tablet, lisinopril (PRINIVIL,ZESTRIL) 40 MG tablet, DISCONTINUED: lisinopril (PRINIVIL,ZESTRIL) 40 MG tablet  Type 2 diabetes mellitus without complication, with long-term current use of insulin (HCC) - Plan: metFORMIN (GLUCOPHAGE-XR) 500 MG 24 hr tablet, atorvastatin (LIPITOR) 20 MG tablet, Insulin Glargine (TOUJEO SOLOSTAR) 300 UNIT/ML SOPN  Patient instructed to sign release of records form from her previous PCP. Refills as above. BP well controlled. Issues from hospitalization resolved. I would like to see her records before drawing lab work. Counseled on diet and exercise. She was given healthy diet handout in [facility]. Will increase dose of Lipitor given dx of DM. If tolerating 20 mg dose well, will increase to 40 mg daily. Patient should return in around 2.5 mo for a dedicated DM visit. The patient voiced understanding and agreement to the plan.   [name] BrielleWendling, DO 07/29/16  3:57 PM

## 2016-08-05 ENCOUNTER — Emergency Department (HOSPITAL_BASED_OUTPATIENT_CLINIC_OR_DEPARTMENT_OTHER)
Admission: EM | Admit: 2016-08-05 | Discharge: 2016-08-05 | Disposition: A | Discharge status: Home / Self Care | Payer: BLUE CROSS/BLUE SHIELD | Attending: Emergency Medicine | Admitting: Emergency Medicine

## 2016-08-05 ENCOUNTER — Emergency Department (HOSPITAL_BASED_OUTPATIENT_CLINIC_OR_DEPARTMENT_OTHER): Payer: BLUE CROSS/BLUE SHIELD

## 2016-08-05 ENCOUNTER — Encounter (HOSPITAL_BASED_OUTPATIENT_CLINIC_OR_DEPARTMENT_OTHER): Payer: Self-pay

## 2016-08-05 DIAGNOSIS — Z79899 Other long term (current) drug therapy: Secondary | ICD-10-CM | POA: Diagnosis not present

## 2016-08-05 DIAGNOSIS — I10 Essential (primary) hypertension: Secondary | ICD-10-CM | POA: Insufficient documentation

## 2016-08-05 DIAGNOSIS — E119 Type 2 diabetes mellitus without complications: Secondary | ICD-10-CM | POA: Diagnosis not present

## 2016-08-05 DIAGNOSIS — Z7984 Long term (current) use of oral hypoglycemic drugs: Secondary | ICD-10-CM | POA: Diagnosis not present

## 2016-08-05 DIAGNOSIS — M545 Low back pain, unspecified: Secondary | ICD-10-CM

## 2016-08-05 DIAGNOSIS — F17210 Nicotine dependence, cigarettes, uncomplicated: Secondary | ICD-10-CM | POA: Insufficient documentation

## 2016-08-05 DIAGNOSIS — N200 Calculus of kidney: Secondary | ICD-10-CM | POA: Diagnosis not present

## 2016-08-05 LAB — BASIC METABOLIC PANEL
Anion gap: 10 (ref 5–15)
BUN: 17 mg/dL (ref 6–20)
CHLORIDE: 99 mmol/L — AB (ref 101–111)
CO2: 26 mmol/L (ref 22–32)
CREATININE: 0.93 mg/dL (ref 0.44–1.00)
Calcium: 9.1 mg/dL (ref 8.9–10.3)
Glucose, Bld: 241 mg/dL — ABNORMAL HIGH (ref 65–99)
POTASSIUM: 4 mmol/L (ref 3.5–5.1)
SODIUM: 135 mmol/L (ref 135–145)

## 2016-08-05 LAB — CBC WITH DIFFERENTIAL/PLATELET
BASOS ABS: 0 10*3/uL (ref 0.0–0.1)
Basophils Relative: 0 %
EOS ABS: 0.1 10*3/uL (ref 0.0–0.7)
EOS PCT: 2 %
HCT: 39.9 % (ref 36.0–46.0)
HEMOGLOBIN: 13.2 g/dL (ref 12.0–15.0)
LYMPHS PCT: 27 %
Lymphs Abs: 1.4 10*3/uL (ref 0.7–4.0)
MCH: 30.5 pg (ref 26.0–34.0)
MCHC: 33.1 g/dL (ref 30.0–36.0)
MCV: 92.1 fL (ref 78.0–100.0)
Monocytes Absolute: 0.4 10*3/uL (ref 0.1–1.0)
Monocytes Relative: 7 %
NEUTROS PCT: 64 %
Neutro Abs: 3.3 10*3/uL (ref 1.7–7.7)
PLATELETS: 200 10*3/uL (ref 150–400)
RBC: 4.33 MIL/uL (ref 3.87–5.11)
RDW: 13.7 % (ref 11.5–15.5)
WBC: 5.2 10*3/uL (ref 4.0–10.5)

## 2016-08-05 LAB — URINALYSIS, ROUTINE W REFLEX MICROSCOPIC
Bilirubin Urine: NEGATIVE
Glucose, UA: NEGATIVE mg/dL
HGB URINE DIPSTICK: NEGATIVE
Ketones, ur: NEGATIVE mg/dL
LEUKOCYTES UA: NEGATIVE
NITRITE: NEGATIVE
PROTEIN: NEGATIVE mg/dL
SPECIFIC GRAVITY, URINE: 1.022 (ref 1.005–1.030)
pH: 5 (ref 5.0–8.0)

## 2016-08-05 MED ORDER — ONDANSETRON 8 MG PO TBDP
8.0000 mg | ORAL_TABLET | Freq: Once | ORAL | Status: AC
  Administered 2016-08-05: 8 mg via ORAL
  Filled 2016-08-05: qty 1

## 2016-08-05 MED ORDER — ONDANSETRON HCL 4 MG PO TABS: 4.0000 mg | ORAL_TABLET | Freq: Four times a day (QID) | ORAL | 0 refills | Status: DC

## 2016-08-05 NOTE — ED Notes (Signed)
Patient given water for PO challenge.  

## 2016-08-05 NOTE — ED Notes (Signed)
ED Provider at bedside. 

## 2016-08-05 NOTE — Discharge Instructions (Signed)
ER lab work and imaging is reassuring. You do have 2 tiny cyst on yuor kidneys. Take Zofran for nausea. Make sure you're checking her blood sugar regularly. If you are not improved in the next 2-3 days follow-up with her primary care doctor or return to the ED.

## 2016-08-05 NOTE — ED Triage Notes (Signed)
C/o lower back pain x 1 day nausea x 3 days-"feel like a UTI"-NAD-steady gait

## 2016-08-05 NOTE — ED Provider Notes (Signed)
[facility] Provider Note   CSN: [id number] Arrival date & time: 08/05/16  1531  By signing my name below, I, Doreatha Martin, attest that this documentation has been prepared under the direction and in the presence of  Demetrios Loll, PA-C. Electronically Signed: Doreatha Martin, ED Scribe. 08/05/16. 5:26 PM.    History   Chief Complaint Chief Complaint  Patient presents with  . Back Pain    HPI Patricia Shelton is a 56 y.o. female who presents to the Emergency Department complaining of persistent nausea that began a week ago with associated lower back pain that began this afternoon. She reports a pink tint once to her urine last week, but no dysuria, hematuria, frequency or urgency since. Pt reports she sits at a desk for long periods at work, and denies recent lower back injury, heavy lifting, trauma or falls. Pt reports a GI bug has been going around at her work recently. Pt has h/o hysterectomy, appendectomy and cholecystectomy. She denies fever, diarrhea, vaginal bleeding or discharge, vomiting, abdominal pain, diarrhea.   The history is provided by the patient. No language interpreter was used.    Past Medical History:  Diagnosis Date  . Diabetes mellitus without complication (HCC)   . High cholesterol   . History of chicken pox   . Hypertension   . Obesity   . Tobacco abuse     Patient Active Problem List   Diagnosis Date Noted  . Chest pain 07/11/2016  . HTN (hypertension) 07/11/2016  . DM2 (diabetes mellitus, type 2) (HCC) 07/11/2016    Past Surgical History:  Procedure Laterality Date  . ABDOMINAL HYSTERECTOMY    . APPENDECTOMY    . CHOLECYSTECTOMY      OB History    No data available       Home Medications    Prior to Admission medications   Medication Sig Start Date End Date Taking? Authorizing Provider  aspirin 81 MG chewable tablet Chew 81 mg by mouth daily.     [provider]  atorvastatin (LIPITOR) 20 MG tablet Take 1 tablet (20 mg  total) by mouth daily. 07/29/16   Sharlene Dory, DO  BD PEN NEEDLE NANO U/F 32G X 4 MM MISC As directed 07/10/16   [provider]  CHANTIX CONTINUING MONTH PAK 1 MG tablet Take 1 tablet (1 mg total) by mouth 2 (two) times daily. 07/29/16   Sharlene Dory, DO  hydrochlorothiazide (HYDRODIURIL) 25 MG tablet Take 1 tablet (25 mg total) by mouth daily. 07/29/16   Wendling, Jilda Roche, DO  Insulin Glargine (TOUJEO SOLOSTAR) 300 UNIT/ML SOPN Inject 10 Units into the skin at bedtime. 07/29/16   Sharlene Dory, DO  lisinopril (PRINIVIL,ZESTRIL) 40 MG tablet Take 1 tablet (40 mg total) by mouth daily. 07/29/16   Sharlene Dory, DO  metFORMIN (GLUCOPHAGE-XR) 500 MG 24 hr tablet Take 2 tablets (1,000 mg total) by mouth 2 (two) times daily. 07/29/16   Sharlene Dory, DO  omeprazole (PRILOSEC) 40 MG capsule Take 40 mg by mouth daily.     [provider]    Family History Family History  Problem Relation Age of Onset  . Other Mother        valvular heart disease from Rheumatic heart disease - s/p bioprosthetic mvr/avr  . Cancer Mother        Breast  . CAD Maternal Grandmother        CABG  . Diabetes Maternal Grandfather  Social History Social History  Substance Use Topics  . Smoking status: Current Every Day Smoker    Packs/day: 0.50    Years: 40.00    Types: Cigarettes  . Smokeless tobacco: Never Used     Comment: smoked 1.5 ppd for ~ 40 yrs - recently cut down to 1/2 ppd.  . Alcohol use Yes     Comment: occ     Allergies   Sulfa antibiotics   Review of Systems Review of Systems  Constitutional: Negative for fever.  Gastrointestinal: Positive for nausea. Negative for abdominal pain, diarrhea and vomiting.  Genitourinary: Negative for dysuria, frequency, hematuria, urgency, vaginal bleeding and vaginal discharge.  Musculoskeletal: Positive for back pain.  Neurological: Positive for headaches.  All other systems reviewed and  are negative.    Physical Exam Updated Vital Signs BP (!) 124/94 (BP Location: Left Arm)   Pulse (!) 102   Temp 99.1 F (37.3 C) (Oral)   Resp 18   Ht 5\' 5"  (1.651 m)   Wt 223 lb (101.2 kg)   SpO2 98%   BMI 37.11 kg/m   Physical Exam  Constitutional: She appears well-developed and well-nourished.  HENT:  Head: Normocephalic and atraumatic.  Eyes: Conjunctivae are normal.  Cardiovascular: Normal rate, regular rhythm and normal heart sounds.   Pulmonary/Chest: Effort normal and breath sounds normal. No respiratory distress. She has no wheezes. She has no rales.  Abdominal: Soft. Bowel sounds are normal. She exhibits no distension and no mass. There is no tenderness. There is no rebound and no guarding.  Musculoskeletal: Normal range of motion. She exhibits tenderness.  Paraspinal lumbar tenderness. No midline or CVA tenderness.   Neurological: She is alert.  Skin: Skin is warm and dry.  Psychiatric: She has a normal mood and affect. Her behavior is normal.  Nursing note and vitals reviewed.    ED Treatments / Results   DIAGNOSTIC STUDIES: Oxygen Saturation is 98% on RA, normal by my interpretation.    COORDINATION OF CARE: 5:23 PM Discussed treatment plan with pt at bedside which includes UA, US, labs and pt agreed to plan.    Labs (all labs ordered are listed, but only abnormal results are displayed) Labs Reviewed  BASIC METABOLIC PANEL - Abnormal; Notable for the following:       Result Value   Chloride 99 (*)    Glucose, Bld 241 (*)    All other components within normal limits  URINALYSIS, ROUTINE W REFLEX MICROSCOPIC  CBC WITH DIFFERENTIAL/PLATELET    EKG  EKG Interpretation None       Radiology [place] Abdomen Complete  Result Date: 08/05/2016 CLINICAL DATA:  Diffuse back pain and flank pain with nausea x2 days. History of cholecystectomy. EXAM: ABDOMEN ULTRASOUND COMPLETE COMPARISON:  10/13/2010 CT FINDINGS: Gallbladder: Surgically absent. Common bile  duct: Diameter: 3.9 mm and normal. Liver: Slightly coarsened echotexture without discrete mass or biliary dilatation. IVC: No abnormality visualized. Pancreas: Visualized portion unremarkable. The head was obscured by bowel gas. Spleen: Size and appearance within normal limits. Right Kidney: Length: 11.4 cm. Echogenicity within normal limits. No mass or hydronephrosis visualized. There is an anechoic simple cyst noted of the right kidney measuring 6.7 x 6.6 x 8.8 cm off the interpolar lateral aspect and a septated complex cyst measuring 4.1 x 3.5 x 3.9 cm medially with posterior septation noted without worrisome features. Left Kidney: Length: 11.4 cm. Echogenicity within normal limits. No mass or hydronephrosis visualized. Anechoic simple cyst measuring 2 x 1 x 1.4  cm in the lateral upper pole of the kidney and a 2.2 x 1.4 x 2 cm hypoechoic complex appearing cysts with tiny punctate calcification along its posterior wall at the junction of the interpolar and lower pole. Abdominal aorta: No aneurysm visualized. Other findings: None. IMPRESSION: 1. Bilateral simple and complex renal cysts as above described consistent with Bosniak category 1 and 2 lesions. These appear stable allowing for differences in imaging technique. No solid-appearing mass, obstructive uropathy or nephrolithiasis is noted. 2. Cholecystectomy. Electronically Signed   By: Tollie Eth M.D.   On: 08/05/2016 18:42    Procedures Procedures (including critical care time)  Medications Ordered in ED Medications  ondansetron (ZOFRAN-ODT) disintegrating tablet 8 mg (8 mg Oral Given 08/05/16 1820)     Initial Impression / Assessment and Plan / ED Course  I have reviewed the triage vital signs and the nursing notes.  Pertinent labs & imaging results that were available during my care of the patient were reviewed by me and considered in my medical decision making (see chart for details).     Patient resents to the ED with complaints of low  back pain and nausea. States that she may have a UTI. Patient is nontoxic appearing. Vital signs are stable. She is afebrile and not tachycardic. Does not meet SIRS or SEPSIS criteria. Abdominal exam is benign. She does have mild paraspinal tenderness. No significant CVA tenderness. Labs are reassuring. Kidney function is normal. Glucose mildly elevated. Patient is on insulin. States that her blood sugars relatively controlled. Urine shows no signs of infection. No ketones in the urine. Doubt DKA. Encourage patient to keep close eye on her blood sugars at home. Using her insulin regularly. Did obtain ultrasound which showed no acute abnormalities does note to bilateral renal cyst otherwise no other abnormalities. The patient has had a hysterectomy, cholecystectomy, appendectomy. Doubt any other acute intra-abdominal abnormalities at this time. Patient given Zofran with improvement in her symptoms. Able tolerate by mouth fluids without difficulties. Encouraged follow-up with her PCP. Unsure of etiologies of her low back pain. Encourage Motrin and Tylenol with heating.  Pt is hemodynamically stable, in NAD, & able to ambulate in the ED. Pain has been managed & has no complaints prior to dc. Pt is comfortable with above plan and is stable for discharge at this time. All questions were answered prior to disposition. Strict return precautions for f/u to the ED were discussed.   Final Clinical Impressions(s) / ED Diagnoses   Final diagnoses:  Acute bilateral low back pain without sciatica    New Prescriptions New Prescriptions   ONDANSETRON (ZOFRAN) 4 MG TABLET    Take 1 tablet (4 mg total) by mouth every 6 (six) hours.    I personally performed the services described in this documentation, which was scribed in my presence. The recorded information has been reviewed and is accurate.    Rise Mu, PA-C 08/05/16 1925    Tegeler, Canary Brim, MD 08/06/16 [phone]

## 2016-08-05 NOTE — ED Notes (Signed)
Patient returned from ultrasound.

## 2016-08-12 ENCOUNTER — Ambulatory Visit (INDEPENDENT_AMBULATORY_CARE_PROVIDER_SITE_OTHER): Payer: BLUE CROSS/BLUE SHIELD | Admitting: Family Medicine

## 2016-08-12 ENCOUNTER — Telehealth: Payer: Self-pay | Admitting: Family Medicine

## 2016-08-12 ENCOUNTER — Encounter: Payer: Self-pay | Admitting: Family Medicine

## 2016-08-12 VITALS — BP 120/80 | HR 88 | Temp 98.2°F | Ht 65.5 in | Wt 222.0 lb

## 2016-08-12 DIAGNOSIS — T466X5A Adverse effect of antihyperlipidemic and antiarteriosclerotic drugs, initial encounter: Secondary | ICD-10-CM | POA: Diagnosis not present

## 2016-08-12 DIAGNOSIS — M791 Myalgia, unspecified site: Secondary | ICD-10-CM

## 2016-08-12 DIAGNOSIS — N3000 Acute cystitis without hematuria: Secondary | ICD-10-CM | POA: Diagnosis not present

## 2016-08-12 LAB — POC URINALSYSI DIPSTICK (AUTOMATED)
Bilirubin, UA: NEGATIVE
Blood, UA: NEGATIVE
Glucose, UA: NEGATIVE
KETONES UA: NEGATIVE
LEUKOCYTES UA: NEGATIVE
NITRITE UA: NEGATIVE
PH UA: 6 (ref 5.0–8.0)
PROTEIN UA: NEGATIVE
Spec Grav, UA: >=1.03 — AB (ref 1.010–1.025)
UROBILINOGEN UA: 0.2 U/dL

## 2016-08-12 MED ORDER — NITROFURANTOIN MONOHYD MACRO 100 MG PO CAPS: 100.0000 mg | ORAL_CAPSULE | Freq: Two times a day (BID) | ORAL | 0 refills | Status: DC

## 2016-08-12 NOTE — Patient Instructions (Signed)
Stay well hydrated.   CoQ10 is a supplement that can be helpful with muscle aches.  I will send you the results of your urine testing on MyChart and we will reach out if our plan needs adjustment.

## 2016-08-12 NOTE — Telephone Encounter (Signed)
Elevated alk phos noted in past. Renal function appropriate. Recent colonoscopy 10/2015. Pap 2011, done forever with hysterectomy.

## 2016-08-12 NOTE — Progress Notes (Signed)
Chief Complaint  Patient presents with  . Follow-up from the ER    for nausea    Subjective: Patient is a 56 y.o. female here for ER f/u nausea.  Was seen in ED on 08/05/16 and dx'd with acute low back pain and nausea. She was given a short course of Zofran and told to f/u with PCP. Since that time, she feels no improvement. Still having back painAnd she notices change after we increased her dose of Lipitor. Her BP medication had been changed prior to that, but she was doing well for a little over a week with that change. She finally gets improvement after drinking lots of water. Additionally, she continues to have burning and increased frequency with urination. She is a urine tract infections in the past and states this is very similar. She also states that previous infections have been missed on urinalysis when they were confirmed on culture. While she has felt warm, no known fevers.  ROS: GI: +nausea GU: +dyuria MSK: +back pain  Family History  Problem Relation Age of Onset  . Other Mother        valvular heart disease from Rheumatic heart disease - s/p bioprosthetic mvr/avr  . Cancer Mother        Breast  . CAD Maternal Grandmother        CABG  . Diabetes Maternal Grandfather    Past Medical History:  Diagnosis Date  . Diabetes mellitus without complication (HCC)   . High cholesterol   . History of chicken pox   . Hypertension   . Obesity   . Tobacco abuse    Allergies  Allergen Reactions  . Sulfa Antibiotics Nausea And Vomiting    Current Outpatient Prescriptions:  .  aspirin 81 MG chewable tablet, Chew 81 mg by mouth daily. , Disp: , Rfl:  .  atorvastatin (LIPITOR) 20 MG tablet, Take 0.5 tablets (10 mg total) by mouth daily., Disp: 30 tablet, Rfl: 2 .  BD PEN NEEDLE NANO U/F 32G X 4 MM MISC, As directed, Disp: , Rfl: 11 .  CHANTIX CONTINUING MONTH PAK 1 MG tablet, Take 1 tablet (1 mg total) by mouth 2 (two) times daily., Disp: 60 tablet, Rfl: 0 .  hydrochlorothiazide  (HYDRODIURIL) 25 MG tablet, Take 1 tablet (25 mg total) by mouth daily., Disp: 90 tablet, Rfl: 1 .  Insulin Glargine (TOUJEO SOLOSTAR) 300 UNIT/ML SOPN, Inject 10 Units into the skin at bedtime., Disp: 3 mL, Rfl: 6 .  lisinopril (PRINIVIL,ZESTRIL) 40 MG tablet, Take 1 tablet (40 mg total) by mouth daily., Disp: 90 tablet, Rfl: 1 .  metFORMIN (GLUCOPHAGE-XR) 500 MG 24 hr tablet, Take 2 tablets (1,000 mg total) by mouth 2 (two) times daily., Disp: , Rfl:  .  omeprazole (PRILOSEC) 40 MG capsule, Take 40 mg by mouth daily. , Disp: , Rfl:  .  ondansetron (ZOFRAN) 4 MG tablet, Take 1 tablet (4 mg total) by mouth every 6 (six) hours., Disp: 10 tablet, Rfl: 0 .  nitrofurantoin, macrocrystal-monohydrate, (MACROBID) 100 MG capsule, Take 1 capsule (100 mg total) by mouth 2 (two) times daily., Disp: 10 capsule, Rfl: 0  Objective: BP 120/80 (BP Location: Left Arm, Patient Position: Sitting, Cuff Size: Normal)   Pulse 88   Temp 98.2 F (36.8 C) (Oral)   Ht 5' 5.5" (1.664 m)   Wt 222 lb (100.7 kg)   SpO2 98%   BMI 36.38 kg/m  General: Awake, appears stated age HEENT: MMM, EOMi Heart: RRR, no bruits  or LE edema Lungs: CTAB, no rales, wheezes or rhonchi. No accessory muscle use Abd: BS+, soft, NT, ND, no masses or organomegaly MSK: Mild TTP over lumbar paraspinal musculature Psych: Age appropriate judgment and insight, normal affect and mood  Assessment and Plan: Acute cystitis without hematuria - Plan: nitrofurantoin, macrocrystal-monohydrate, (MACROBID) 100 MG capsule, POCT Urinalysis Dipstick (Automated), Urine Culture  Myalgia due to statin - Plan: atorvastatin (LIPITOR) 20 MG tablet  Orders as above. Will empirically treat, UA and culture today. Decreased Lipitor 20 mA daily to 10 mg daily. She is okay with cutting the pill in half. She was encouraged to stay very well-hydrated. Cleansing Q10 was also listed as an option to help with muscle aches. Follow-up as originally scheduled unless  needed. The patient voiced understanding and agreement to the plan.  [name] Beaver FallsWendling, DO 08/12/16  8:44 AM

## 2016-08-15 ENCOUNTER — Other Ambulatory Visit: Payer: Self-pay | Admitting: Family Medicine

## 2016-08-15 LAB — URINE CULTURE

## 2016-08-15 MED ORDER — CEPHALEXIN 500 MG PO CAPS: 500.0000 mg | ORAL_CAPSULE | Freq: Two times a day (BID) | ORAL | 0 refills | Status: AC

## 2016-09-22 ENCOUNTER — Telehealth: Payer: Self-pay | Admitting: Family Medicine

## 2016-09-22 NOTE — Telephone Encounter (Signed)
Spoke with the pt to verify if she needed prior authorization or does she just need a refill.  Pt stated that she was told by the pharmacy that she needed a PA and they had sent the information.  Informed the pt that we have not received the PA request.  Pt stated that she will call the pharmacy and ask them to resend the PA.//AB/CMA

## 2016-09-22 NOTE — Telephone Encounter (Signed)
[facility] [phone]) asking the pt to RTC regarding medication request.//AB/CMA

## 2016-09-22 NOTE — Telephone Encounter (Signed)
Caller name: Relation to WU:JWJXpt:self Call back number:[phone] Pharmacy: [facility] [address]  Reason for call: pt is needing prior authorization on her rx metFORMIN (GLUCOPHAGE-XR) 500 MG 24 hr tablet, states she is completely out and the pharmacy informed her she needed approval before any more refills

## 2016-09-23 ENCOUNTER — Other Ambulatory Visit: Payer: Self-pay | Admitting: Family Medicine

## 2016-09-23 DIAGNOSIS — Z794 Long term (current) use of insulin: Principal | ICD-10-CM

## 2016-09-23 DIAGNOSIS — E119 Type 2 diabetes mellitus without complications: Secondary | ICD-10-CM

## 2016-09-26 MED ORDER — METFORMIN HCL ER 500 MG PO TB24: 1000.0000 mg | ORAL_TABLET | Freq: Two times a day (BID) | ORAL | 5 refills | Status: DC

## 2016-09-26 NOTE — Telephone Encounter (Signed)
Rx approved and sent to the pharmacy by e-script.//AB/CMA 

## 2016-10-07 ENCOUNTER — Ambulatory Visit (INDEPENDENT_AMBULATORY_CARE_PROVIDER_SITE_OTHER): Payer: BLUE CROSS/BLUE SHIELD | Admitting: Family Medicine

## 2016-10-07 ENCOUNTER — Encounter: Payer: Self-pay | Admitting: Family Medicine

## 2016-10-07 VITALS — BP 120/80 | HR 84 | Temp 98.3°F | Ht 65.5 in | Wt 226.8 lb

## 2016-10-07 DIAGNOSIS — M791 Myalgia, unspecified site: Secondary | ICD-10-CM

## 2016-10-07 DIAGNOSIS — Z23 Encounter for immunization: Secondary | ICD-10-CM | POA: Diagnosis not present

## 2016-10-07 DIAGNOSIS — Z794 Long term (current) use of insulin: Secondary | ICD-10-CM

## 2016-10-07 DIAGNOSIS — Z8639 Personal history of other endocrine, nutritional and metabolic disease: Secondary | ICD-10-CM

## 2016-10-07 DIAGNOSIS — T466X5A Adverse effect of antihyperlipidemic and antiarteriosclerotic drugs, initial encounter: Secondary | ICD-10-CM | POA: Diagnosis not present

## 2016-10-07 DIAGNOSIS — I10 Essential (primary) hypertension: Secondary | ICD-10-CM | POA: Diagnosis not present

## 2016-10-07 DIAGNOSIS — Z8349 Family history of other endocrine, nutritional and metabolic diseases: Secondary | ICD-10-CM | POA: Diagnosis not present

## 2016-10-07 DIAGNOSIS — Z1231 Encounter for screening mammogram for malignant neoplasm of breast: Secondary | ICD-10-CM

## 2016-10-07 DIAGNOSIS — Z1239 Encounter for other screening for malignant neoplasm of breast: Secondary | ICD-10-CM

## 2016-10-07 DIAGNOSIS — E119 Type 2 diabetes mellitus without complications: Secondary | ICD-10-CM

## 2016-10-07 LAB — LIPID PANEL
CHOL/HDL RATIO: 4.5 ratio (ref <5.0)
Cholesterol: 211 mg/dL — ABNORMAL HIGH (ref <200)
HDL: 47 mg/dL — AB (ref >50)
LDL Cholesterol: 102 mg/dL — ABNORMAL HIGH (ref <100)
Triglycerides: 308 mg/dL — ABNORMAL HIGH (ref <150)
VLDL: 62 mg/dL — AB (ref <30)

## 2016-10-07 LAB — COMPREHENSIVE METABOLIC PANEL
ALK PHOS: 172 U/L — AB (ref 33–130)
ALT: 25 U/L (ref 6–29)
AST: 20 U/L (ref 10–35)
Albumin: 4.1 g/dL (ref 3.6–5.1)
BUN: 13 mg/dL (ref 7–25)
CALCIUM: 9.6 mg/dL (ref 8.6–10.4)
CO2: 25 mmol/L (ref 20–32)
Chloride: 99 mmol/L (ref 98–110)
Creat: 1.12 mg/dL — ABNORMAL HIGH (ref 0.50–1.05)
GLUCOSE: 228 mg/dL — AB (ref 65–99)
POTASSIUM: 4.3 mmol/L (ref 3.5–5.3)
Sodium: 136 mmol/L (ref 135–146)
Total Bilirubin: 0.4 mg/dL (ref 0.2–1.2)
Total Protein: 6.6 g/dL (ref 6.1–8.1)

## 2016-10-07 LAB — CBC
HCT: 42.6 % (ref 35.0–45.0)
Hemoglobin: 14 g/dL (ref 11.7–15.5)
MCH: 30.4 pg (ref 27.0–33.0)
MCHC: 32.9 g/dL (ref 32.0–36.0)
MCV: 92.4 fL (ref 80.0–100.0)
MPV: 11.6 fL (ref 7.5–12.5)
PLATELETS: 280 10*3/uL (ref 140–400)
RBC: 4.61 MIL/uL (ref 3.80–5.10)
RDW: 14.8 % (ref 11.0–15.0)
WBC: 7.3 10*3/uL (ref 3.8–10.8)

## 2016-10-07 MED ORDER — METFORMIN HCL ER 500 MG PO TB24: 500.0000 mg | ORAL_TABLET | Freq: Two times a day (BID) | ORAL | 5 refills | Status: DC

## 2016-10-07 NOTE — Progress Notes (Signed)
Subjective:   Chief Complaint  Patient presents with  . Follow-up    3 mos on DM    Patricia Shelton is a 56 y.o. female here for follow-up of diabetes.   Sanye's self monitored glucose range is mid 100's in AM and 200's in evening. Patient denies hypoglycemic reactions.  She checks her glucose levels 1-2 times per day. Patient does require insulin.  Toujeo 10 u nightly. Medications include: Metformin 1000 XR BID- she is getting some stomach upset from it Patient exercises 0 days per week on average.   She does take an aspirin daily. Statin? Yes ACEi/ARB? Yes  Hypertension Patient presents for hypertension follow up. She does not routinely monitor home BP's. She is compliant with medications- lisinopril 40 mg daily, HCTZ 25 mg daily. Patient has these side effects of medication: none She is sometimes adhering to a healthy diet overall. Exercise: no scheduled exercise   Past Medical History:  Diagnosis Date  . Diabetes mellitus without complication (HCC)   . High cholesterol   . History of chicken pox   . Hypertension   . Obesity   . Tobacco abuse     Past Surgical History:  Procedure Laterality Date  . ABDOMINAL HYSTERECTOMY    . APPENDECTOMY    . CHOLECYSTECTOMY      Social History   Social History  . Marital status: Married   Social History Main Topics  . Smoking status: Current Every Day Smoker    Packs/day: 0.50    Years: 40.00    Types: Cigarettes  . Smokeless tobacco: Never Used     Comment: smoked 1.5 ppd for ~ 40 yrs - recently cut down to 1/2 ppd.  . Alcohol use Yes     Comment: occ  . Drug use: No   Current Outpatient Prescriptions on File Prior to Visit  Medication Sig Dispense Refill  . aspirin 81 MG chewable tablet Chew 81 mg by mouth daily.     . atorvastatin (LIPITOR) 20 MG tablet Take 0.5 tablets (10 mg total) by mouth daily. 30 tablet 2  . BD PEN NEEDLE NANO U/F 32G X 4 MM MISC As directed  11  . CHANTIX CONTINUING MONTH PAK 1 MG tablet Take 1  tablet (1 mg total) by mouth 2 (two) times daily. 60 tablet 0  . hydrochlorothiazide (HYDRODIURIL) 25 MG tablet Take 1 tablet (25 mg total) by mouth daily. 90 tablet 1  . Insulin Glargine (TOUJEO SOLOSTAR) 300 UNIT/ML SOPN Inject 10 Units into the skin at bedtime. 3 mL 6  . lisinopril (PRINIVIL,ZESTRIL) 40 MG tablet Take 1 tablet (40 mg total) by mouth daily. 90 tablet 1  . metFORMIN (GLUCOPHAGE-XR) 500 MG 24 hr tablet Take 2 tablets (1,000 mg total) by mouth 2 (two) times daily. 120 tablet 5  . omeprazole (PRILOSEC) 40 MG capsule Take 40 mg by mouth daily.     . ondansetron (ZOFRAN) 4 MG tablet Take 1 tablet (4 mg total) by mouth every 6 (six) hours. 10 tablet 0    Related testing: Foot exam(monofilament and inspection):done Date of retinal exam: 1.5 years ago  Done by:  Dr. Greven Pneumovax: Getting today!  Review of Systems: Eye:  No recent significant change in vision Pulmonary:  No SOB Cardiovascular:  No chest pain, no palpitations Skin/Integumentary ROS:  No abnormal skin lesions reported Neurologic:  No numbness, tingling  Objective:  BP 120/80 (BP Location: Left Arm, Patient Position: Sitting, Cuff Size: Normal)   Pulse 84     Temp 98.3 F (36.8 C) (Oral)   Ht 5' 5.5" (1.664 m)   Wt 226 lb 12.8 oz (102.9 kg)   SpO2 95%   BMI 37.17 kg/m  General:  Well developed, well nourished, in no apparent distress Skin:  Warm, no pallor or diaphoresis Head:  Normocephalic, atraumatic Eyes:  Pupils equal and round, sclera anicteric without injection  Nose:  External nares without trauma, no discharge Throat/Pharynx:  Lips and gingiva without lesion Neck: Neck supple.  No obvious thyromegaly or masses.  No bruits Lungs:  clear to auscultation, breath sounds equal bilaterally, no wheezes, rales, or stridor Cardio:  regular rate and rhythm without murmurs, no bruits, no LE edema Neuro:  Sensation intact to pinprick on feet Psych: Age appropriate judgment and insight  Assessment:    Type 2 diabetes mellitus without complication, with long-term current use of insulin (HCC) - Plan: atorvastatin (LIPITOR) 20 MG tablet, Insulin Glargine (TOUJEO SOLOSTAR) 300 UNIT/ML SOPN, metFORMIN (GLUCOPHAGE-XR) 500 MG 24 hr tablet, Hemoglobin A1C, HM Diabetes Foot Exam, CANCELED: Hemoglobin A1c, CANCELED: CBC, CANCELED: Lipid panel  Essential hypertension - Plan: CBC, Hemoglobin A1C, Comp Met (CMET), Lipid panel, CANCELED: Comprehensive metabolic panel  Screening breast examination - Plan: MM DIGITAL SCREENING BILATERAL  Myalgia due to statin  Need for pneumococcal vaccination - Plan: Pneumococcal polysaccharide vaccine 23-valent greater than or equal to 2yo subcutaneous/IM  History of non anemic vitamin B12 deficiency - Plan: Vitamin B12   Plan:   Orders as above. Going to try to go on 20 mg Lipitor. She had some back aches before. CoQ10 an option, staying well hydrated is also an option.  Will decrease dose of Metformin to 500 mg BID from 1000 mg BID. She would like to trial lifestyle modifications before adding something else at this time. I am OK with this, but did reinforce that she needs to be diligent as we are decreasing one of her medicines. Increase nighttime dose of basal insulin to 12 u from 10 u Toujeo. OK to increase in increments of 2 u every 2 days until consistently below 140 fasting.  She is a self-proclaimed anti-vaxxer but is willing to get PCV23 today. I am very happy with this.  BP well controlled. No changes. F/u in 3 mo. The patient voiced understanding and agreement to the plan.  [place], DO 10/08/16 3:00 PM

## 2016-10-07 NOTE — Patient Instructions (Addendum)
Get back to exercising.  Clean up the diet.  Let me know if you are having higher sugars, we may officially add back glyburide.   Give us 2-3 business days to get the results of your labs back.   Healthy Eating Plan Many factors influence your heart health, including eating and exercise habits. Heart (coronary) risk increases with abnormal blood fat (lipid) levels. Heart-healthy meal planning includes limiting unhealthy fats, increasing healthy fats, and making other small dietary changes. This includes maintaining a healthy body weight to help keep lipid levels within a normal range.  WHAT IS MY PLAN?  Your health care provider recommends that you:  Drink a glass of water before meals to help with satiety.  Eat slowly.  An alternative to the water is to add Metamucil. This will help with satiety as well. It does contain calories, unlike water.  WHAT TYPES OF FAT SHOULD I CHOOSE?  Choose healthy fats more often. Choose monounsaturated and polyunsaturated fats, such as olive oil and canola oil, flaxseeds, walnuts, almonds, and seeds.  Eat more omega-3 fats. Good choices include salmon, mackerel, sardines, tuna, flaxseed oil, and ground flaxseeds. Aim to eat fish at least two times each week.  Avoid foods with partially hydrogenated oils in them. These contain trans fats. Examples of foods that contain trans fats are stick margarine, some tub margarines, cookies, crackers, and other baked goods. If you are going to avoid a fat, this is the one to avoid!  WHAT GENERAL GUIDELINES DO I NEED TO FOLLOW?  Check food labels carefully to identify foods with trans fats. Avoid these types of options when possible.  Fill one half of your plate with vegetables and green salads. Eat 4-5 servings of vegetables per day. A serving of vegetables equals 1 cup of raw leafy vegetables,  cup of raw or cooked cut-up vegetables, or  cup of vegetable juice.  Fill one fourth of your plate with whole  grains. Look for the word "whole" as the first word in the ingredient list.  Fill one fourth of your plate with lean protein foods.  Eat 4-5 servings of fruit per day. A serving of fruit equals one medium whole fruit,  cup of dried fruit,  cup of fresh, frozen, or canned fruit. Try to avoid fruits in cups/syrups as the sugar content can be high.  Eat more foods that contain soluble fiber. Examples of foods that contain this type of fiber are apples, broccoli, carrots, beans, peas, and barley. Aim to get 20-30 g of fiber per day.  Eat more home-cooked food and less restaurant, buffet, and fast food.  Limit or avoid alcohol.  Limit foods that are high in starch and sugar.  Avoid fried foods when able.  Cook foods by using methods other than frying. Baking, boiling, grilling, and broiling are all great options. Other fat-reducing suggestions include: ? Removing the skin from poultry. ? Removing all visible fats from meats. ? Skimming the fat off of stews, soups, and gravies before serving them. ? Steaming vegetables in water or broth.  Lose weight if you are overweight. Losing just 5-10% of your initial body weight can help your overall health and prevent diseases such as diabetes and heart disease.  Increase your consumption of nuts, legumes, and seeds to 4-5 servings per week. One serving of dried beans or legumes equals  cup after being cooked, one serving of nuts equals 1 ounces, and one serving of seeds equals  ounce or 1 tablespoon.  WHAT  ARE GOOD FOODS CAN I EAT? Grains Grainy breads (try to find bread that is 3 g of fiber per slice or greater), oatmeal, light popcorn. Whole-grain cereals. Rice and pasta, including brown rice and those that are made with whole wheat. Edamame pasta is a great alternative to grain pasta. It has a higher protein content. Try to avoid significant consumption of white bread, sugary cereals, or pastries/baked goods.  Vegetables All vegetables.  Cooked white potatoes do not count as vegetables.  Fruits All fruits, but limit pineapple and bananas as these fruits have a higher sugar content.  Meats and Other Protein Sources Lean, well-trimmed beef, veal, pork, and lamb. Chicken and [place] without skin. All fish and shellfish. Wild duck, rabbit, pheasant, and venison. Egg whites or low-cholesterol egg substitutes. Dried beans, peas, lentils, and tofu.Seeds and most nuts.  Dairy Low-fat or nonfat cheeses, including ricotta, string, and mozzarella. Skim or 1% milk that is liquid, powdered, or evaporated. Buttermilk that is made with low-fat milk. Nonfat or low-fat yogurt. Soy/Almond milk are good alternatives if you cannot handle dairy.  Beverages Water is the best for you. Sports drinks with less sugar are more desirable unless you are a highly active athlete.  Sweets and Desserts Sherbets and fruit ices. Honey, jam, marmalade, jelly, and syrups. Dark chocolate.  Eat all sweets and desserts in moderation.  Fats and Oils Nonhydrogenated (trans-free) margarines. Vegetable oils, including soybean, sesame, sunflower, olive, peanut, safflower, corn, canola, and cottonseed. Salad dressings or mayonnaise that are made with a vegetable oil. Limit added fats and oils that you use for cooking, baking, salads, and as spreads.  Other Cocoa powder. Coffee and tea. Most condiments.  The items listed above may not be a complete list of recommended foods or beverages. Contact your dietitian for more options.

## 2016-10-08 LAB — HEMOGLOBIN A1C
HEMOGLOBIN A1C: 8.7 % — AB (ref <5.7)
MEAN PLASMA GLUCOSE: 203 mg/dL

## 2016-10-08 LAB — VITAMIN B12: VITAMIN B 12: 831 pg/mL (ref 200–1100)

## 2016-10-09 ENCOUNTER — Telehealth: Payer: Self-pay | Admitting: Family

## 2016-10-11 NOTE — Telephone Encounter (Signed)
Opened in error

## 2016-11-02 ENCOUNTER — Other Ambulatory Visit: Payer: Self-pay | Admitting: Family Medicine

## 2016-11-02 DIAGNOSIS — I10 Essential (primary) hypertension: Secondary | ICD-10-CM

## 2016-11-02 MED ORDER — LISINOPRIL 40 MG PO TABS: 40.0000 mg | ORAL_TABLET | Freq: Every day | ORAL | 1 refills | Status: DC

## 2016-11-02 MED ORDER — BD PEN NEEDLE NANO U/F 32G X 4 MM MISC: 11 refills | Status: DC

## 2016-11-03 ENCOUNTER — Encounter: Payer: Self-pay | Admitting: *Deleted

## 2016-11-08 ENCOUNTER — Ambulatory Visit (HOSPITAL_BASED_OUTPATIENT_CLINIC_OR_DEPARTMENT_OTHER)
Admission: RE | Admit: 2016-11-08 | Discharge: 2016-11-08 | Disposition: A | Discharge status: Home / Self Care | Payer: BLUE CROSS/BLUE SHIELD | Source: Ambulatory Visit | Attending: Family Medicine | Admitting: Family Medicine

## 2016-11-08 DIAGNOSIS — Z1231 Encounter for screening mammogram for malignant neoplasm of breast: Secondary | ICD-10-CM | POA: Diagnosis not present

## 2016-11-08 DIAGNOSIS — Z1239 Encounter for other screening for malignant neoplasm of breast: Secondary | ICD-10-CM

## 2016-11-10 ENCOUNTER — Encounter: Payer: Self-pay | Admitting: Family Medicine

## 2016-11-16 ENCOUNTER — Encounter: Payer: Self-pay | Admitting: Family Medicine

## 2016-11-16 DIAGNOSIS — Z794 Long term (current) use of insulin: Principal | ICD-10-CM

## 2016-11-16 DIAGNOSIS — E119 Type 2 diabetes mellitus without complications: Secondary | ICD-10-CM

## 2016-11-17 MED ORDER — ATORVASTATIN CALCIUM 20 MG PO TABS: 20.0000 mg | ORAL_TABLET | Freq: Every day | ORAL | 3 refills | Status: DC

## 2016-12-14 ENCOUNTER — Other Ambulatory Visit: Payer: Self-pay | Admitting: Family Medicine

## 2017-06-14 ENCOUNTER — Ambulatory Visit (INDEPENDENT_AMBULATORY_CARE_PROVIDER_SITE_OTHER): Payer: PRIVATE HEALTH INSURANCE | Admitting: Family Medicine

## 2017-06-14 ENCOUNTER — Encounter: Payer: Self-pay | Admitting: Family Medicine

## 2017-06-14 VITALS — BP 130/86 | HR 96 | Temp 98.3°F | Ht 65.5 in | Wt 208.1 lb

## 2017-06-14 DIAGNOSIS — B3731 Acute candidiasis of vulva and vagina: Secondary | ICD-10-CM

## 2017-06-14 DIAGNOSIS — B373 Candidiasis of vulva and vagina: Secondary | ICD-10-CM

## 2017-06-14 DIAGNOSIS — J301 Allergic rhinitis due to pollen: Secondary | ICD-10-CM | POA: Diagnosis not present

## 2017-06-14 DIAGNOSIS — I10 Essential (primary) hypertension: Secondary | ICD-10-CM

## 2017-06-14 MED ORDER — PRAVASTATIN SODIUM 40 MG PO TABS: 40.0000 mg | ORAL_TABLET | Freq: Every day | ORAL | 3 refills | Status: DC

## 2017-06-14 MED ORDER — LEVOCETIRIZINE DIHYDROCHLORIDE 5 MG PO TABS: 5.0000 mg | ORAL_TABLET | Freq: Every evening | ORAL | 2 refills | Status: DC

## 2017-06-14 MED ORDER — FLUCONAZOLE 150 MG PO TABS: 150.0000 mg | ORAL_TABLET | Freq: Once | ORAL | 0 refills | Status: AC

## 2017-06-14 NOTE — Progress Notes (Signed)
Pre visit review using our clinic review tool, if applicable. No additional management support is needed unless otherwise documented below in the visit note. 

## 2017-06-14 NOTE — Progress Notes (Signed)
Chief Complaint  Patient presents with  . Nasal Congestion  . Eye Drainage  . Vaginitis    Patricia BoatmanMary Shelton here for URI complaints.  Duration: 2 weeks  Associated symptoms: itchy watery eyes, fevers (Tmax 99.1 F), rhinorrhea, cough, and sore throat Denies: sinus pain, ear pain, ear drainage, shortness of breath  Treatment to date: Claritin D Sick contacts: No   Has yeast infection also. Will get them frequently 2/2 DM.   HTN- has been checking at home, readings running in 120-130's/80's on average. She has been losing weight, using amorphophallus.   ROS:  Const: Denies fevers HEENT: As noted in HPI Lungs: No SOB  Past Medical History:  Diagnosis Date  . Diabetes mellitus without complication (HCC)   . High cholesterol   . History of chicken pox   . Hypertension   . Obesity   . Tobacco abuse    Family History  Problem Relation Age of Onset  . Other Mother        valvular heart disease from Rheumatic heart disease - s/p bioprosthetic mvr/avr  . Cancer Mother        Breast  . CAD Maternal Grandmother        CABG  . Diabetes Maternal Grandfather     BP 130/86 (BP Location: Left Arm, Patient Position: Sitting, Cuff Size: Large)   Pulse 96   Temp 98.3 F (36.8 C) (Oral)   Ht 5' 5.5" (1.664 m)   Wt 208 lb 2 oz (94.4 kg)   SpO2 97%   BMI 34.11 kg/m  General: Awake, alert, appears stated age HEENT: AT, [place], ears patent b/l and TM's neg, nares patent w/o discharge, pharynx pink and without exudates, post nasal drainage noted along her right side of pharyn, MMM Neck: No masses or asymmetry Heart: RRR Lungs: CTAB, no accessory muscle use Psych: Age appropriate judgment and insight, normal mood and affect  Seasonal allergic rhinitis due to pollen - Plan: levocetirizine (XYZAL) 5 MG tablet  Yeast vaginitis - Plan: fluconazole (DIFLUCAN) 150 MG tablet  Essential hypertension  Orders as above. Cont current regimen, will change Lipitor to pravastatin secondary to  cost. F/u as able for DM visit.  Pt voiced understanding and agreement to the plan.  [name] [place], DO 06/14/17 12:13 PM

## 2017-06-14 NOTE — Patient Instructions (Addendum)
Claritin (loratadine), Allegra (fexofenadine), Zyrtec (cetirizine); these are listed in order from weakest to strongest. Generic, and therefore cheaper, options are in the parentheses.   Flonase (fluticasone); nasal spray that is over the counter. 2 sprays each nostril, once daily. Aim towards the same side eye when you spray.  There are available OTC, and the generic versions, which may be cheaper, are in parentheses. Show this to a pharmacist if you have trouble finding any of these items.  Let me know if there are any issues with medication cost.  Let us know if you need anything.

## 2017-07-12 ENCOUNTER — Other Ambulatory Visit: Payer: Self-pay | Admitting: Family Medicine

## 2017-07-12 ENCOUNTER — Encounter: Payer: Self-pay | Admitting: Family Medicine

## 2017-07-12 ENCOUNTER — Ambulatory Visit (INDEPENDENT_AMBULATORY_CARE_PROVIDER_SITE_OTHER): Payer: PRIVATE HEALTH INSURANCE | Admitting: Family Medicine

## 2017-07-12 VITALS — BP 132/98 | HR 87 | Temp 98.7°F | Ht 66.0 in | Wt 202.4 lb

## 2017-07-12 DIAGNOSIS — I10 Essential (primary) hypertension: Secondary | ICD-10-CM | POA: Diagnosis not present

## 2017-07-12 DIAGNOSIS — E119 Type 2 diabetes mellitus without complications: Secondary | ICD-10-CM

## 2017-07-12 DIAGNOSIS — Z720 Tobacco use: Secondary | ICD-10-CM | POA: Diagnosis not present

## 2017-07-12 DIAGNOSIS — R748 Abnormal levels of other serum enzymes: Secondary | ICD-10-CM | POA: Diagnosis not present

## 2017-07-12 DIAGNOSIS — Z794 Long term (current) use of insulin: Secondary | ICD-10-CM

## 2017-07-12 LAB — COMPREHENSIVE METABOLIC PANEL
ALT: 10 U/L (ref 0–35)
AST: 10 U/L (ref 0–37)
Albumin: 3.8 g/dL (ref 3.5–5.2)
Alkaline Phosphatase: 146 U/L — ABNORMAL HIGH (ref 39–117)
BUN: 9 mg/dL (ref 6–23)
CO2: 28 meq/L (ref 19–32)
Calcium: 9.5 mg/dL (ref 8.4–10.5)
Chloride: 103 mEq/L (ref 96–112)
Creatinine, Ser: 0.81 mg/dL (ref 0.40–1.20)
GFR: 77.12 mL/min (ref >60.00)
GLUCOSE: 215 mg/dL — AB (ref 70–99)
Potassium: 4.8 mEq/L (ref 3.5–5.1)
SODIUM: 140 meq/L (ref 135–145)
Total Bilirubin: 0.5 mg/dL (ref 0.2–1.2)
Total Protein: 6.7 g/dL (ref 6.0–8.3)

## 2017-07-12 LAB — LIPID PANEL
Cholesterol: 207 mg/dL — ABNORMAL HIGH (ref 0–200)
HDL: 47.9 mg/dL (ref >39.00)
LDL Cholesterol: 130 mg/dL — ABNORMAL HIGH (ref 0–99)
NonHDL: 158.79
TRIGLYCERIDES: 146 mg/dL (ref 0.0–149.0)
Total CHOL/HDL Ratio: 4
VLDL: 29.2 mg/dL (ref 0.0–40.0)

## 2017-07-12 LAB — HEMOGLOBIN A1C: Hgb A1c MFr Bld: 10.1 % — ABNORMAL HIGH (ref 4.6–6.5)

## 2017-07-12 LAB — MICROALBUMIN / CREATININE URINE RATIO
Creatinine,U: 58.5 mg/dL
Microalb Creat Ratio: 1.2 mg/g (ref 0.0–30.0)
Microalb, Ur: <0.7 mg/dL (ref 0.0–1.9)

## 2017-07-12 MED ORDER — LISINOPRIL 40 MG PO TABS: 40.0000 mg | ORAL_TABLET | Freq: Every day | ORAL | 1 refills | Status: DC

## 2017-07-12 MED ORDER — INSULIN GLARGINE 300 UNIT/ML ~~LOC~~ SOPN: 12.0000 [IU] | PEN_INJECTOR | Freq: Every day | SUBCUTANEOUS | 6 refills | Status: DC

## 2017-07-12 MED ORDER — CHANTIX 1 MG PO TABS: ORAL_TABLET | ORAL | 0 refills | Status: DC

## 2017-07-12 MED ORDER — HYDROCHLOROTHIAZIDE 25 MG PO TABS: 25.0000 mg | ORAL_TABLET | Freq: Every day | ORAL | 1 refills | Status: DC

## 2017-07-12 NOTE — Patient Instructions (Addendum)
Continue checking your blood pressures at home. If it doesn't get back to normal with the addition of your hydrochlorothiazide, let us know.  Stay active, keep diet clean.  Great work with cutting down on cigarettes.  Let us know if you need anything.

## 2017-07-12 NOTE — Progress Notes (Signed)
Pre visit review using our clinic review tool, if applicable. No additional management support is needed unless otherwise documented below in the visit note. 

## 2017-07-12 NOTE — Progress Notes (Signed)
Subjective:   Chief Complaint  Patient presents with  . Diabetes    Patricia Shelton is a 57 y.o. female here for follow-up of diabetes.   Shakiara's self monitored glucose range is low 200's Patient denies hypoglycemic reactions. She checks her glucose levels 2 times per day. Patient does require insulin- Toujeo 12 u/night.   Medications include: metformin 1000 mg bid Reports diet is occasionally healthy. Patient exercises 0 days per week on average.    Hypertension Patient presents for hypertension follow up. She does monitor home blood pressures. Blood pressures ranging on average from 120's/80's. She is compliant with medications which she has them; she has been out of hydrochlorothiazide for a few weeks-lisinopril 40 mg daily, hydrochlorothiazide 25 mg daily. Patient has these side effects of medication: none She is sometimes adhering to a healthy diet overall. Exercise: Physically active at work  Tobacco abuse The patient is down to half a pack a day from 1 pack a day smoking.  She is breathing much better and feels dedicated to quitting.  She is using Chantix and having no adverse effects.  Past Medical History:  Diagnosis Date  . Diabetes mellitus without complication (HCC)   . High cholesterol   . History of chicken pox   . Hypertension   . Obesity   . Tobacco abuse     Past Surgical History:  Procedure Laterality Date  . ABDOMINAL HYSTERECTOMY    . APPENDECTOMY    . CHOLECYSTECTOMY    . Removed fatty tumor from back      Family History  Problem Relation Age of Onset  . Other Mother        valvular heart disease from Rheumatic heart disease - s/p bioprosthetic mvr/avr  . Cancer Mother        Breast  . CAD Maternal Grandmother        CABG  . Diabetes Maternal Grandfather    Current Outpatient Medications on File Prior to Visit  Medication Sig Dispense Refill  . aspirin 81 MG chewable tablet Chew 81 mg by mouth daily.     . BD PEN NEEDLE NANO U/F 32G X 4 MM MISC  Use as directed with insulin 90 each 11  . CHANTIX 1 MG tablet TAKE 1 TABLET(1 MG) BY MOUTH TWICE DAILY 60 tablet 0  . hydrochlorothiazide (HYDRODIURIL) 25 MG tablet Take 1 tablet (25 mg total) by mouth daily. 90 tablet 1  . Insulin Glargine (TOUJEO SOLOSTAR) 300 UNIT/ML SOPN Inject 12 Units into the skin at bedtime. 3 mL 6  . levocetirizine (XYZAL) 5 MG tablet Take 1 tablet (5 mg total) by mouth every evening. 30 tablet 2  . lisinopril (PRINIVIL,ZESTRIL) 40 MG tablet Take 1 tablet (40 mg total) by mouth daily. 90 tablet 1  . metFORMIN (GLUCOPHAGE-XR) 500 MG 24 hr tablet Take 1 tablet (500 mg total) by mouth 2 (two) times daily. 120 tablet 5  . omeprazole (PRILOSEC) 40 MG capsule Take 40 mg by mouth daily.     . ondansetron (ZOFRAN) 4 MG tablet Take 1 tablet (4 mg total) by mouth every 6 (six) hours. 10 tablet 0  . pravastatin (PRAVACHOL) 40 MG tablet Take 1 tablet (40 mg total) by mouth daily. 90 tablet 3   Related testing: Date of retinal exam: Due  Done by:  Dr. Dan Humphreys Pneumovax: done Flu Shot: done  Review of Systems: Pulmonary:  No SOB Cardiovascular:  No chest pain  Objective:  BP (!) 132/98 (BP Location: Left Arm, Patient  Position: Sitting, Cuff Size: Normal)   Pulse 87   Temp 98.7 F (37.1 C) (Oral)   Ht  (1.676 m)   Wt 202 lb 6 oz (91.8 kg)   SpO2 96%   BMI 32.66 kg/m  General:  Well developed, well nourished, in no apparent distress Skin:  Warm, no pallor or diaphoresis Head:  Normocephalic, atraumatic Eyes:  Pupils equal and round, sclera anicteric without injection  Lungs:  CTAB, no access msc use Cardio:  RRR, no bruits, no LE edema Musculoskeletal:  Symmetrical muscle groups noted without atrophy or deformity Neuro:  Sensation intact to pinprick on feet Psych: Age appropriate judgment and insight  Assessment:   Type 2 diabetes mellitus without complication, with long-term current use of insulin (HCC) - Plan: Insulin Glargine (TOUJEO SOLOSTAR) 300 UNIT/ML  SOPN, Comprehensive metabolic panel, Hemoglobin A1c, Lipid panel, Microalbumin / creatinine urine ratio, HM Diabetes Foot Exam  Essential hypertension - Plan: lisinopril (PRINIVIL,ZESTRIL) 40 MG tablet, hydrochlorothiazide (HYDRODIURIL) 25 MG tablet  Tobacco abuse  Elevated alkaline phosphatase level - Plan: Comprehensive metabolic panel, Alkaline Phosphatase Isoenzymes   Plan:   Orders as above. Counseled on diet and exercise. We will likely add Trulicity, a payment card was given. Continue current blood pressure regimen, she will let us know if home readings are elevated being back on the hydrochlorthiazide. She was commended for cutting down on smoking.  Chantix renewed. Due to some insurance issues, she never followed up for elevated alkaline phosphatase.  We will check an isoenzyme and ggt today. F/u in 3 mo. The patient voiced understanding and agreement to the plan.  Jilda Roche [place], DO 07/12/17 12:09 PM

## 2017-07-13 ENCOUNTER — Other Ambulatory Visit (INDEPENDENT_AMBULATORY_CARE_PROVIDER_SITE_OTHER): Payer: PRIVATE HEALTH INSURANCE

## 2017-07-13 DIAGNOSIS — R748 Abnormal levels of other serum enzymes: Secondary | ICD-10-CM

## 2017-07-13 LAB — GAMMA GT: GGT: 53 U/L — ABNORMAL HIGH (ref 7–51)

## 2017-07-16 ENCOUNTER — Other Ambulatory Visit: Payer: Self-pay | Admitting: Family Medicine

## 2017-07-16 MED ORDER — DULAGLUTIDE 1.5 MG/0.5ML ~~LOC~~ SOAJ: 1.5000 mg | SUBCUTANEOUS | 3 refills | Status: DC

## 2017-07-17 ENCOUNTER — Other Ambulatory Visit: Payer: Self-pay | Admitting: Family Medicine

## 2017-07-17 DIAGNOSIS — R748 Abnormal levels of other serum enzymes: Secondary | ICD-10-CM

## 2017-07-17 LAB — ALKALINE PHOSPHATASE ISOENZYMES
Alkaline phosphatase (APISO): 151 U/L — ABNORMAL HIGH (ref 33–130)
Bone Isoenzymes: 13 % — ABNORMAL LOW (ref 28–66)
Intestinal Isoenzymes: 5 % (ref 1–24)
LIVER ISOENZYMES (ALP ISO): 63 % (ref 25–69)
MACROHEPATIC ISOENZYMES: 19 % — AB

## 2017-07-17 MED ORDER — PIOGLITAZONE HCL 30 MG PO TABS: 30.0000 mg | ORAL_TABLET | Freq: Every day | ORAL | 2 refills | Status: DC

## 2017-07-21 ENCOUNTER — Ambulatory Visit (HOSPITAL_BASED_OUTPATIENT_CLINIC_OR_DEPARTMENT_OTHER)
Admission: RE | Admit: 2017-07-21 | Discharge: 2017-07-21 | Disposition: A | Discharge status: Home / Self Care | Payer: PRIVATE HEALTH INSURANCE | Source: Ambulatory Visit | Attending: Family Medicine | Admitting: Family Medicine

## 2017-07-21 DIAGNOSIS — R748 Abnormal levels of other serum enzymes: Secondary | ICD-10-CM

## 2017-07-21 DIAGNOSIS — N281 Cyst of kidney, acquired: Secondary | ICD-10-CM | POA: Diagnosis not present

## 2017-09-06 ENCOUNTER — Ambulatory Visit (INDEPENDENT_AMBULATORY_CARE_PROVIDER_SITE_OTHER): Payer: Self-pay | Admitting: Family Medicine

## 2017-09-06 ENCOUNTER — Encounter: Payer: Self-pay | Admitting: Family Medicine

## 2017-09-06 VITALS — BP 138/84 | HR 88 | Temp 98.3°F | Resp 16 | Ht 66.0 in | Wt 200.4 lb

## 2017-09-06 DIAGNOSIS — E119 Type 2 diabetes mellitus without complications: Secondary | ICD-10-CM

## 2017-09-06 DIAGNOSIS — Z794 Long term (current) use of insulin: Secondary | ICD-10-CM

## 2017-09-06 DIAGNOSIS — M940 Chondrocostal junction syndrome [Tietze]: Secondary | ICD-10-CM

## 2017-09-06 MED ORDER — METFORMIN HCL ER 500 MG PO TB24: 500.0000 mg | ORAL_TABLET | Freq: Two times a day (BID) | ORAL | 3 refills | Status: DC

## 2017-09-06 NOTE — Patient Instructions (Signed)
Start Actos when able.   Keep up the great work with losing weight. You look great!  Consider lotion for your arms or covering them.  We will check your A1c at your next visit.  Let us know if you need anything.

## 2017-09-06 NOTE — Progress Notes (Signed)
Subjective:   Chief Complaint  Patient presents with  . Diabetes    Patricia BoatmanMary Shelton is a 57 y.o. female here for follow-up of diabetes.   Patricia Shelton's self monitored glucose range is 120-170's.  Patient denies hypoglycemic reactions. She checks her glucose levels 3 times per day. Patient does require insulin.   Medications include: Metformin 1000 mg XR bid, Actos has been called in but she has not picked it up yet.  She is waiting to get insurance coverage. Reports diet is now healthy overall. She has lost around 15 pounds. Patient exercises 7 days per week on average.    Past Medical History:  Diagnosis Date  . Diabetes mellitus without complication (HCC)   . High cholesterol   . History of chicken pox   . Hypertension   . Obesity   . Tobacco abuse     Related testing: Date of retinal exam: Due Pneumovax: done  Review of Systems: Pulmonary:  No SOB Cardiovascular:  No chest pain  Objective:  BP 138/84 (BP Location: Left Arm, Cuff Size: Large)   Pulse 88   Temp 98.3 F (36.8 C) (Oral)   Resp 16   Ht 5\' 6"  (1.676 m)   Wt 200 lb 6.4 oz (90.9 kg)   SpO2 99%   BMI 32.35 kg/m  General:  Well developed, well nourished, in no apparent distress Skin:  Warm, no pallor or diaphoresis Head:  Normocephalic, atraumatic Eyes:  Pupils equal and round, sclera anicteric without injection  Lungs:  CTAB, no access msc use Cardio:  RRR, no bruits, no LE edema Musculoskeletal:  Symmetrical muscle groups noted without atrophy or deformity; TTP over L anterior ax line at R 11 Neuro:  Sensation intact to pinprick on feet Psych: Age appropriate judgment and insight  Assessment:   Type 2 diabetes mellitus without complication, with long-term current use of insulin (HCC) - Plan: metFORMIN (GLUCOPHAGE-XR) 500 MG 24 hr tablet  Costochondritis   Plan:   Orders as above. Counseled on diet and exercise.  She is doing well and continues to lose weight.  I would still like her to get the Actos.   She is encouraged to follow-up with her ophthalmologist for diabetic eye exam. Stretch area for R side, heat, Tylenol.  F/u as originally scheduled. The patient voiced understanding and agreement to the plan.  [name] [place], DO 09/06/17 8:52 AM

## 2017-09-08 ENCOUNTER — Encounter: Payer: Self-pay | Admitting: Family Medicine

## 2017-09-11 ENCOUNTER — Other Ambulatory Visit: Payer: Self-pay | Admitting: Family Medicine

## 2017-09-11 MED ORDER — NITROFURANTOIN MACROCRYSTAL 100 MG PO CAPS: 100.0000 mg | ORAL_CAPSULE | Freq: Two times a day (BID) | ORAL | 0 refills | Status: AC

## 2017-10-05 ENCOUNTER — Encounter: Payer: Self-pay | Admitting: Family Medicine

## 2017-10-12 ENCOUNTER — Ambulatory Visit (INDEPENDENT_AMBULATORY_CARE_PROVIDER_SITE_OTHER): Payer: BLUE CROSS/BLUE SHIELD | Admitting: Family Medicine

## 2017-10-12 ENCOUNTER — Encounter: Payer: Self-pay | Admitting: Family Medicine

## 2017-10-12 VITALS — BP 126/82 | HR 90 | Temp 98.5°F | Ht 66.0 in | Wt 196.2 lb

## 2017-10-12 DIAGNOSIS — E669 Obesity, unspecified: Secondary | ICD-10-CM

## 2017-10-12 DIAGNOSIS — E1169 Type 2 diabetes mellitus with other specified complication: Secondary | ICD-10-CM

## 2017-10-12 DIAGNOSIS — E119 Type 2 diabetes mellitus without complications: Secondary | ICD-10-CM

## 2017-10-12 LAB — HEMOGLOBIN A1C: Hgb A1c MFr Bld: 8.4 % — ABNORMAL HIGH (ref 4.6–6.5)

## 2017-10-12 MED ORDER — DULAGLUTIDE 1.5 MG/0.5ML ~~LOC~~ SOAJ: SUBCUTANEOUS | 5 refills | Status: DC

## 2017-10-12 MED ORDER — OMEPRAZOLE 40 MG PO CPDR: 40.0000 mg | DELAYED_RELEASE_CAPSULE | Freq: Every day | ORAL | 5 refills | Status: DC

## 2017-10-12 MED ORDER — OMEPRAZOLE 40 MG PO CPDR: 40.0000 mg | DELAYED_RELEASE_CAPSULE | Freq: Every day | ORAL | 1 refills | Status: DC

## 2017-10-12 NOTE — Progress Notes (Signed)
Pre visit review using our clinic review tool, if applicable. No additional management support is needed unless otherwise documented below in the visit note. 

## 2017-10-12 NOTE — Patient Instructions (Signed)
Start the Xyzal again.  Keep up the great work.  Give us 2-[place] business days to get the results of your labs back.  Let us know if you need anything.

## 2017-10-12 NOTE — Progress Notes (Signed)
Subjective:   Chief Complaint  Patient presents with  . Diabetes    Patricia Shelton is a 57 y.o. female here for follow-up of diabetes.   Patricia Shelton's self monitored glucose range is low-mid 100's.  Patient is having some hypoglycemic reactions. She checks her glucose levels 1 time per day. Patient does not require insulin.   Medications include: Trulicity 1.5 mg/week and Metformin 500 mg bid Reports diet is healthy overall. Portions much better since starting GLP-1. Patient exercises 0 days per week on average.    Past Medical History:  Diagnosis Date  . Diabetes mellitus without complication (HCC)   . High cholesterol   . History of chicken pox   . Hypertension   . Obesity   . Tobacco abuse    Related testing: Date of retinal exam: insurance not covering eye exam Pneumovax: done  Review of Systems: Pulmonary:  No SOB Cardiovascular:  No chest pain  Objective:  BP 126/82 (BP Location: Left Arm, Patient Position: Sitting, Cuff Size: Normal)   Pulse 90   Temp 98.5 F (36.9 C) (Oral)   Ht 5\' 6"  (1.676 m)   Wt 196 lb 4 oz (89 kg)   SpO2 97%   BMI 31.68 kg/m  General:  Well developed, well nourished, in no apparent distress Head:  Normocephalic, atraumatic Eyes:  Pupils equal and round, sclera anicteric without injection  Lungs:  CTAB, no access msc use Cardio:  RRR, no bruits, no LE edema Psych: Age appropriate judgment and insight  Assessment:   Diabetes mellitus type 2 in obese (HCC) - Plan: Ambulatory referral to Ophthalmology, Dulaglutide (TRULICITY) 1.5 MG/0.5ML SOPN, omeprazole (PRILOSEC) 40 MG capsule, Hemoglobin A1c   Plan:   Orders as above. Counseled on diet and exercise. She is doing well.  Refer to ophtho rather than optometry as she does not have vision coverage.  F/u in 3 mo. The patient voiced understanding and agreement to the plan.  [name] [place], DO 10/12/17 9:12 AM

## 2017-10-16 ENCOUNTER — Ambulatory Visit: Payer: PRIVATE HEALTH INSURANCE | Admitting: Family Medicine

## 2017-10-26 DIAGNOSIS — E119 Type 2 diabetes mellitus without complications: Secondary | ICD-10-CM | POA: Diagnosis not present

## 2017-11-24 ENCOUNTER — Other Ambulatory Visit: Payer: Self-pay | Admitting: Family Medicine

## 2017-12-21 ENCOUNTER — Encounter: Payer: Self-pay | Admitting: Family Medicine

## 2017-12-21 MED ORDER — BD PEN NEEDLE NANO U/F 32G X 4 MM MISC: 0 refills | Status: DC

## 2018-01-13 ENCOUNTER — Other Ambulatory Visit: Payer: Self-pay | Admitting: Family Medicine

## 2018-01-13 DIAGNOSIS — I10 Essential (primary) hypertension: Secondary | ICD-10-CM

## 2018-01-15 ENCOUNTER — Ambulatory Visit: Payer: BLUE CROSS/BLUE SHIELD | Admitting: Family Medicine

## 2018-01-18 ENCOUNTER — Ambulatory Visit (INDEPENDENT_AMBULATORY_CARE_PROVIDER_SITE_OTHER): Payer: BLUE CROSS/BLUE SHIELD | Admitting: Family Medicine

## 2018-01-18 ENCOUNTER — Encounter: Payer: Self-pay | Admitting: Family Medicine

## 2018-01-18 VITALS — BP 118/78 | HR 99 | Temp 98.4°F | Ht 65.5 in | Wt 200.0 lb

## 2018-01-18 DIAGNOSIS — Z720 Tobacco use: Secondary | ICD-10-CM | POA: Diagnosis not present

## 2018-01-18 DIAGNOSIS — E669 Obesity, unspecified: Secondary | ICD-10-CM | POA: Diagnosis not present

## 2018-01-18 DIAGNOSIS — E1169 Type 2 diabetes mellitus with other specified complication: Secondary | ICD-10-CM

## 2018-01-18 DIAGNOSIS — I10 Essential (primary) hypertension: Secondary | ICD-10-CM | POA: Diagnosis not present

## 2018-01-18 LAB — HEMOGLOBIN A1C: HEMOGLOBIN A1C: 6.6 % — AB (ref 4.6–6.5)

## 2018-01-18 MED ORDER — CHANTIX 1 MG PO TABS: ORAL_TABLET | ORAL | 0 refills | Status: DC

## 2018-01-18 NOTE — Progress Notes (Signed)
Pre visit review using our clinic review tool, if applicable. No additional management support is needed unless otherwise documented below in the visit note. 

## 2018-01-18 NOTE — Progress Notes (Signed)
Subjective:   Chief Complaint  Patient presents with  . Diabetes    Patricia BoatmanMary Shelton is a 57 y.o. female here for follow-up of diabetes.   [name]'s self monitored glucose range is low 100's.  Patient denies hypoglycemic reactions. She checks her glucose levels 2 times per day. Patient does require insulin. Toujeo 12 u/night Medications include: Metformin 1000 mg XR Diet is healthy Exercise: Active at work, some walking  Hypertension Patient presents for hypertension follow up. She does monitor home blood pressures. Blood pressures ranging on average from 110's/70's. She is compliant with medications- lisinopril 40 mg/d, HCTZ 25 mg/d. Patient has these side effects of medication: none She is adhering to a healthy diet overall. Exercise: active at work  Smoking Down to smoking 3 cigarettes daily. Would like a refill of Chantix. Breathing better since weaning down.    Past Medical History:  Diagnosis Date  . Diabetes mellitus without complication (HCC)   . High cholesterol   . History of chicken pox   . Hypertension   . Obesity   . Tobacco abuse      Related testing: Eye exam 11/28/17; Done by:  [facility] Pneumovax: done Flu Shot: done  Review of Systems: Pulmonary:  No SOB Cardiovascular:  No chest pain  Objective:  BP 118/78 (BP Location: Left Arm, Patient Position: Sitting, Cuff Size: Normal)   Pulse 99   Temp 98.4 F (36.9 C) (Oral)   Ht 5' 5.5" (1.664 m)   Wt 200 lb (90.7 kg)   SpO2 93%   BMI 32.78 kg/m  General:  Well developed, well nourished, in no apparent distress Skin:  Warm, no pallor or diaphoresis Head:  Normocephalic, atraumatic Eyes:  Pupils equal and round, sclera anicteric without injection  Lungs:  CTAB, no access msc use Cardio:  RRR, no bruits, no LE edema Psych: Age appropriate judgment and insight  Assessment:   Diabetes mellitus type 2 in obese (HCC) - Plan: Hemoglobin A1c  Essential hypertension  Tobacco abuse - Plan: CHANTIX 1  MG tablet   Plan:   Orders as above. Counseled on diet and exercise. She is doing well.  OK to stop checking BP's at home. Stop smoking. Chantix refilled. F/u in 3 mo. The patient voiced understanding and agreement to the plan.  Patricia Rocheicholas Paul [place], DO 01/18/18 2:07 PM

## 2018-01-18 NOTE — Patient Instructions (Addendum)
Give us 2-3 business days to get the results of your labs back.   Keep the diet clean and stay active.  Stop smoking.  Let me know if you change your mind about the flu shot.  Let us know if you need anything.

## 2018-04-06 ENCOUNTER — Other Ambulatory Visit: Payer: Self-pay | Admitting: Family Medicine

## 2018-04-06 DIAGNOSIS — E1169 Type 2 diabetes mellitus with other specified complication: Secondary | ICD-10-CM

## 2018-04-06 DIAGNOSIS — E669 Obesity, unspecified: Principal | ICD-10-CM

## 2018-05-19 ENCOUNTER — Other Ambulatory Visit: Payer: Self-pay | Admitting: Family Medicine

## 2018-05-19 DIAGNOSIS — I10 Essential (primary) hypertension: Secondary | ICD-10-CM

## 2018-05-26 ENCOUNTER — Telehealth: Payer: BLUE CROSS/BLUE SHIELD | Admitting: Nurse Practitioner

## 2018-05-26 DIAGNOSIS — Z20822 Contact with and (suspected) exposure to covid-19: Secondary | ICD-10-CM

## 2018-05-26 DIAGNOSIS — R6889 Other general symptoms and signs: Principal | ICD-10-CM

## 2018-05-26 NOTE — Progress Notes (Signed)
E-Visit for Corona Virus Screening  Based on your current symptoms, it seems unlikely that your symptoms are related to the Coronavirus.   Coronavirus disease 2019 (COVID-19) is a respiratory illness that can spread from person to person. The virus that causes COVID-19 is a new virus that was first identified in the country of [place] but is now found in multiple other countries and has spread to the [place].  Symptoms associated with the virus are mild to severe fever, cough, and shortness of breath. There is currently no vaccine to protect against COVID-19, and there is no specific antiviral treatment for the virus.   To be considered HIGH RISK for Coronavirus (COVID-19), you have to meet the following criteria:  . Traveled to [place], [place], [place], [place] or [place]; or in the [place] to [place], [place], [place], or [place]; and have fever, cough, and shortness of breath within the last 2 weeks of travel OR  . Been in close contact with a person diagnosed with COVID-19 within the last 2 weeks and have fever, cough, and shortness of breath  . IF YOU DO NOT MEET THESE CRITERIA, YOU ARE CONSIDERED LOW RISK FOR COVID-19.   It is vitally important that if you feel that you have an infection such as this virus or any other virus that you stay home and away from places where you may spread it to others.  You should self-quarantine for 14 days if you have symptoms that could potentially be coronavirus and avoid contact with people age 16 and older.   You can use medication such as delsym or mucinex for cough  You may also take acetaminophen (Tylenol) as needed for fever.   Reduce your risk of any infection by using the same precautions used for avoiding the common cold or flu:  Marland Kitchen Wash your hands often with soap and warm water for at least 20 seconds.  If soap and water are not readily available, use an alcohol-based hand sanitizer with at least 60% alcohol.  . If coughing or  sneezing, cover your mouth and nose by coughing or sneezing into the elbow areas of your shirt or coat, into a tissue or into your sleeve (not your hands). . Avoid shaking hands with others and consider head nods or verbal greetings only. . Avoid touching your eyes, nose, or mouth with unwashed hands.  . Avoid close contact with people who are sick. . Avoid places or events with large numbers of people in one location, like concerts or sporting events. . Carefully consider travel plans you have or are making. . If you are planning any travel outside or inside the [place], visit the CDC's Travelers' Health webpage for the latest health notices. . If you have some symptoms but not all symptoms, continue to monitor at home and seek medical attention if your symptoms worsen. . If you are having a medical emergency, call 911.  HOME CARE . Only take medications as instructed by your medical team. . Drink plenty of fluids and get plenty of rest. . A steam or ultrasonic humidifier can help if you have congestion.   GET HELP RIGHT AWAY IF: . You develop worsening fever. . You become short of breath . You cough up blood. . Your symptoms become more severe MAKE SURE YOU   Understand these instructions.  Will watch your condition.  Will get help right away if you are not doing well or get worse.  Your e-visit answers were  reviewed by a board certified advanced clinical practitioner to complete your personal care plan.  Depending on the condition, your plan could have included both over the counter or prescription medications.  If there is a problem please reply once you have received a response from your provider. Your safety is important to [place].  If you have drug allergies check your prescription carefully.    You can use MyChart to ask questions about today's visit, request a non-urgent call back, or ask for a work or school excuse for 24 hours related to this e-Visit. If it has been greater than 24  hours you will need to follow up with your provider, or enter a new e-Visit to address those concerns. You will get an e-mail in the next two days asking about your experience.  I hope that your e-visit has been valuable and will speed your recovery. Thank you for using e-visits.  5 minutes spent reviewing and documenting in chart.

## 2018-07-05 ENCOUNTER — Encounter: Payer: Self-pay | Admitting: Family Medicine

## 2018-07-05 MED ORDER — FLUCONAZOLE 150 MG PO TABS: 150.0000 mg | ORAL_TABLET | Freq: Once | ORAL | 0 refills | Status: AC

## 2018-07-05 NOTE — Telephone Encounter (Signed)
Dr Carmelia Roller -- should pt schedule a Virtual or in office visit for this?

## 2018-07-06 ENCOUNTER — Other Ambulatory Visit: Payer: Self-pay

## 2018-07-06 ENCOUNTER — Encounter: Payer: Self-pay | Admitting: Family Medicine

## 2018-07-06 ENCOUNTER — Ambulatory Visit (INDEPENDENT_AMBULATORY_CARE_PROVIDER_SITE_OTHER): Payer: Self-pay | Admitting: Family Medicine

## 2018-07-06 DIAGNOSIS — B373 Candidiasis of vulva and vagina: Secondary | ICD-10-CM

## 2018-07-06 DIAGNOSIS — B3731 Acute candidiasis of vulva and vagina: Secondary | ICD-10-CM

## 2018-07-06 MED ORDER — FLUCONAZOLE 150 MG PO TABS: ORAL_TABLET | ORAL | 0 refills | Status: DC

## 2018-07-06 NOTE — Progress Notes (Addendum)
Chief Complaint  Patient presents with  . Vaginitis    Patricia Shelton is a 58 y.o. female here for vaginal discharge. Due to COVID-19 pandemic, we are interacting via web portal for an electronic face-to-face visit. I verified patient's ID using 2 identifiers. Patient agreed to proceed with visit via this method. Patient is at home, I am at office. Patient and I are present for visit.   Duration: 3 days Description of discharge: like cottage cheese Odor: No New sexual partner: No Urinary complaints: No +hx of yeast infections, diabetic and unable to get Trulicity due to ins change. That should be better in a couple days per pt. Sugars have gone up to low 200's and she gets yeast infections when this happens. Denies fevers, bleeding, pregnancy abdominal pain.  ROS:  GU: +discharge, denies pain with urination  Past Medical History:  Diagnosis Date  . Diabetes mellitus without complication (HCC)   . High cholesterol   . History of chicken pox   . Hypertension   . Obesity   . Tobacco abuse    Exam No conversational dyspnea Age appropriate judgment and insight Nml affect and mood  Vagina, candidiasis - Plan: fluconazole (DIFLUCAN) 150 MG tablet  Orders as above. Repeat in 48 hrs if no improvement. Inperson visit if no improvement despite this.  F/u as originally scheduled otherwise.  Pt voiced understanding and agreement to the plan.  Jilda Roche [place], DO 07/06/18 9:24 AM

## 2018-07-13 ENCOUNTER — Other Ambulatory Visit: Payer: Self-pay | Admitting: Family Medicine

## 2018-07-13 DIAGNOSIS — Z794 Long term (current) use of insulin: Secondary | ICD-10-CM

## 2018-07-13 DIAGNOSIS — E119 Type 2 diabetes mellitus without complications: Secondary | ICD-10-CM

## 2018-09-07 ENCOUNTER — Other Ambulatory Visit: Payer: Self-pay | Admitting: Family Medicine

## 2018-09-07 DIAGNOSIS — Z720 Tobacco use: Secondary | ICD-10-CM

## 2018-09-18 ENCOUNTER — Other Ambulatory Visit: Payer: Self-pay | Admitting: Family Medicine

## 2018-09-18 DIAGNOSIS — I10 Essential (primary) hypertension: Secondary | ICD-10-CM

## 2018-10-25 ENCOUNTER — Other Ambulatory Visit: Payer: Self-pay | Admitting: Family Medicine

## 2018-10-25 DIAGNOSIS — E1169 Type 2 diabetes mellitus with other specified complication: Secondary | ICD-10-CM

## 2018-10-29 ENCOUNTER — Other Ambulatory Visit: Payer: Self-pay | Admitting: Family Medicine

## 2018-10-29 DIAGNOSIS — E1169 Type 2 diabetes mellitus with other specified complication: Secondary | ICD-10-CM

## 2018-10-29 DIAGNOSIS — E669 Obesity, unspecified: Secondary | ICD-10-CM

## 2018-12-12 ENCOUNTER — Other Ambulatory Visit: Payer: Self-pay | Admitting: *Deleted

## 2018-12-12 DIAGNOSIS — I10 Essential (primary) hypertension: Secondary | ICD-10-CM

## 2018-12-12 MED ORDER — LISINOPRIL 40 MG PO TABS: ORAL_TABLET | ORAL | 0 refills | Status: DC

## 2018-12-12 MED ORDER — HYDROCHLOROTHIAZIDE 25 MG PO TABS: ORAL_TABLET | ORAL | 0 refills | Status: DC

## 2018-12-19 ENCOUNTER — Encounter: Payer: Self-pay | Admitting: Gastroenterology

## 2018-12-21 ENCOUNTER — Other Ambulatory Visit: Payer: Self-pay | Admitting: Family Medicine

## 2018-12-21 DIAGNOSIS — Z794 Long term (current) use of insulin: Secondary | ICD-10-CM

## 2018-12-21 DIAGNOSIS — E119 Type 2 diabetes mellitus without complications: Secondary | ICD-10-CM

## 2018-12-27 ENCOUNTER — Other Ambulatory Visit: Payer: Self-pay | Admitting: Family Medicine

## 2018-12-27 DIAGNOSIS — Z720 Tobacco use: Secondary | ICD-10-CM

## 2019-01-28 ENCOUNTER — Telehealth: Payer: Self-pay

## 2019-01-28 ENCOUNTER — Other Ambulatory Visit: Payer: Self-pay | Admitting: Family Medicine

## 2019-01-28 DIAGNOSIS — E1169 Type 2 diabetes mellitus with other specified complication: Secondary | ICD-10-CM

## 2019-01-28 DIAGNOSIS — E669 Obesity, unspecified: Secondary | ICD-10-CM

## 2019-01-28 MED ORDER — OMEPRAZOLE 40 MG PO CPDR: 40.0000 mg | DELAYED_RELEASE_CAPSULE | Freq: Every day | ORAL | 0 refills | Status: DC

## 2019-01-28 NOTE — Telephone Encounter (Signed)
PA approved.   Request Reference Number: [id number]. TRULICITY INJ 8.1/8.5 is approved through 01/28/2020. For further questions, call [phone]

## 2019-01-28 NOTE — Telephone Encounter (Signed)
PA initiated via Covermymeds; KEY: [id number]. Awaiting determination.

## 2019-02-19 ENCOUNTER — Other Ambulatory Visit: Payer: Self-pay | Admitting: Family Medicine

## 2019-02-19 DIAGNOSIS — E669 Obesity, unspecified: Secondary | ICD-10-CM

## 2019-02-19 DIAGNOSIS — E1169 Type 2 diabetes mellitus with other specified complication: Secondary | ICD-10-CM

## 2019-02-19 MED ORDER — TRULICITY 1.5 MG/0.5ML ~~LOC~~ SOAJ: SUBCUTANEOUS | 3 refills | Status: DC

## 2019-02-20 ENCOUNTER — Telehealth: Payer: Self-pay

## 2019-02-20 NOTE — Telephone Encounter (Signed)
PA initiated via Covermymeds; KEY: [id number]. PA approved. Effective from 02/20/2019 through 02/19/2020

## 2019-03-01 HISTORY — PX: COLONOSCOPY: SHX174

## 2019-03-20 ENCOUNTER — Other Ambulatory Visit: Payer: Self-pay | Admitting: Family Medicine

## 2019-03-20 DIAGNOSIS — I10 Essential (primary) hypertension: Secondary | ICD-10-CM

## 2019-03-20 MED ORDER — HYDROCHLOROTHIAZIDE 25 MG PO TABS: ORAL_TABLET | ORAL | 0 refills | Status: DC

## 2019-03-20 MED ORDER — LISINOPRIL 40 MG PO TABS: ORAL_TABLET | ORAL | 0 refills | Status: DC

## 2019-05-06 ENCOUNTER — Other Ambulatory Visit: Payer: Self-pay | Admitting: Family Medicine

## 2019-05-06 DIAGNOSIS — E669 Obesity, unspecified: Secondary | ICD-10-CM

## 2019-05-06 DIAGNOSIS — E1169 Type 2 diabetes mellitus with other specified complication: Secondary | ICD-10-CM

## 2019-05-06 MED ORDER — OMEPRAZOLE 40 MG PO CPDR: 40.0000 mg | DELAYED_RELEASE_CAPSULE | Freq: Every day | ORAL | 3 refills | Status: DC

## 2019-07-01 ENCOUNTER — Other Ambulatory Visit: Payer: Self-pay | Admitting: Family Medicine

## 2019-07-01 DIAGNOSIS — I10 Essential (primary) hypertension: Secondary | ICD-10-CM

## 2019-07-01 DIAGNOSIS — E669 Obesity, unspecified: Secondary | ICD-10-CM

## 2019-07-01 DIAGNOSIS — E1169 Type 2 diabetes mellitus with other specified complication: Secondary | ICD-10-CM

## 2019-07-01 MED ORDER — LISINOPRIL 40 MG PO TABS: ORAL_TABLET | ORAL | 0 refills | Status: DC

## 2019-07-01 MED ORDER — TRULICITY 1.5 MG/0.5ML ~~LOC~~ SOAJ: SUBCUTANEOUS | 1 refills | Status: DC

## 2019-07-26 ENCOUNTER — Other Ambulatory Visit: Payer: Self-pay | Admitting: Family Medicine

## 2019-07-26 DIAGNOSIS — Z720 Tobacco use: Secondary | ICD-10-CM

## 2019-07-26 MED ORDER — CHANTIX 1 MG PO TABS: ORAL_TABLET | ORAL | 2 refills | Status: DC

## 2019-08-19 ENCOUNTER — Other Ambulatory Visit: Payer: Self-pay | Admitting: Family Medicine

## 2019-08-19 DIAGNOSIS — E1169 Type 2 diabetes mellitus with other specified complication: Secondary | ICD-10-CM

## 2019-08-28 ENCOUNTER — Other Ambulatory Visit: Payer: Self-pay

## 2019-08-28 ENCOUNTER — Encounter: Payer: Self-pay | Admitting: Family Medicine

## 2019-08-28 ENCOUNTER — Ambulatory Visit: Payer: BC Managed Care – PPO | Admitting: Family Medicine

## 2019-08-28 VITALS — BP 142/88 | HR 86 | Temp 96.0°F | Ht 67.0 in | Wt 226.2 lb

## 2019-08-28 DIAGNOSIS — E669 Obesity, unspecified: Secondary | ICD-10-CM

## 2019-08-28 DIAGNOSIS — Z720 Tobacco use: Secondary | ICD-10-CM

## 2019-08-28 DIAGNOSIS — I10 Essential (primary) hypertension: Secondary | ICD-10-CM | POA: Diagnosis not present

## 2019-08-28 DIAGNOSIS — M6751 Plica syndrome, right knee: Secondary | ICD-10-CM

## 2019-08-28 DIAGNOSIS — E1169 Type 2 diabetes mellitus with other specified complication: Secondary | ICD-10-CM

## 2019-08-28 DIAGNOSIS — Z1231 Encounter for screening mammogram for malignant neoplasm of breast: Secondary | ICD-10-CM

## 2019-08-28 LAB — COMPREHENSIVE METABOLIC PANEL
ALT: 12 U/L (ref 0–35)
AST: 14 U/L (ref 0–37)
Albumin: 4.1 g/dL (ref 3.5–5.2)
Alkaline Phosphatase: 160 U/L — ABNORMAL HIGH (ref 39–117)
BUN: 13 mg/dL (ref 6–23)
CO2: 27 mEq/L (ref 19–32)
Calcium: 9.5 mg/dL (ref 8.4–10.5)
Chloride: 102 mEq/L (ref 96–112)
Creatinine, Ser: 0.89 mg/dL (ref 0.40–1.20)
GFR: 64.61 mL/min (ref >60.00)
Glucose, Bld: 191 mg/dL — ABNORMAL HIGH (ref 70–99)
Potassium: 5 mEq/L (ref 3.5–5.1)
Sodium: 137 mEq/L (ref 135–145)
Total Bilirubin: 0.5 mg/dL (ref 0.2–1.2)
Total Protein: 6.4 g/dL (ref 6.0–8.3)

## 2019-08-28 LAB — MICROALBUMIN / CREATININE URINE RATIO
Creatinine,U: 32.2 mg/dL
Microalb Creat Ratio: 2.2 mg/g (ref 0.0–30.0)
Microalb, Ur: <0.7 mg/dL (ref 0.0–1.9)

## 2019-08-28 LAB — LIPID PANEL
Cholesterol: 204 mg/dL — ABNORMAL HIGH (ref 0–200)
HDL: 50.3 mg/dL (ref >39.00)
LDL Cholesterol: 129 mg/dL — ABNORMAL HIGH (ref 0–99)
NonHDL: 153.32
Total CHOL/HDL Ratio: 4
Triglycerides: 122 mg/dL (ref 0.0–149.0)
VLDL: 24.4 mg/dL (ref 0.0–40.0)

## 2019-08-28 LAB — HEMOGLOBIN A1C: Hgb A1c MFr Bld: 8.8 % — ABNORMAL HIGH (ref 4.6–6.5)

## 2019-08-28 MED ORDER — METFORMIN HCL ER 500 MG PO TB24: 500.0000 mg | ORAL_TABLET | Freq: Every day | ORAL | 2 refills | Status: DC

## 2019-08-28 MED ORDER — CHANTIX 1 MG PO TABS: ORAL_TABLET | ORAL | 2 refills | Status: DC

## 2019-08-28 MED ORDER — HYDROCHLOROTHIAZIDE 25 MG PO TABS: ORAL_TABLET | ORAL | 2 refills | Status: DC

## 2019-08-28 MED ORDER — TRULICITY 1.5 MG/0.5ML ~~LOC~~ SOAJ: SUBCUTANEOUS | 5 refills | Status: DC

## 2019-08-28 NOTE — Progress Notes (Signed)
Subjective:   Chief Complaint  Patient presents with  . Follow-up    Patricia Shelton is a 59 y.o. female here for follow-up of diabetes.   Patricia Shelton's self monitored glucose range is 170-220's. Patient denies hypoglycemic reactions. She checks her glucose levels 2 times per day. Patient does require insulin.   Medications include: Metformin XR 500 qd,  Diet is fair.  Exercise: walking  Hypertension Patient presents for hypertension follow up. She does monitor home blood pressures. Blood pressures ranging on average from 120-130's/70-80's. She is compliant with medications- HCTZ Patient has these side effects of medication: none Diet/exercise as above.  R knee pain over past year that has gotten worse. Sharp, relatively constant, outer knee. No inj or change in activity recently. Did have a fx when she was in late teens. It will sometimes swell. No bruising, redness. It has caused her to fall. Does not want surgery. No famhx of OA that she is aware of.    Past Medical History:  Diagnosis Date  . Diabetes mellitus without complication (HCC)   . High cholesterol   . History of chicken pox   . Hypertension   . Obesity   . Tobacco abuse      Related testing: Date of retinal exam: Due Pneumovax: done  Objective:  BP (!) 142/88 (BP Location: Left Arm, Patient Position: Sitting, Cuff Size: Normal)   Pulse 86   Temp (!) 96 F (35.6 C) (Temporal)   Ht 5\' 7"  (1.702 m)   Wt 226 lb 4 oz (102.6 kg)   SpO2 99%   BMI 35.44 kg/m  General:  Well developed, well nourished, in no apparent distress Skin:  Warm, no pallor or diaphoresis Head:  Normocephalic, atraumatic Eyes:  Pupils equal and round, sclera anicteric without injection  Lungs:  CTAB, no access msc use Cardio:  RRR, no bruits, no LE edema Musculoskeletal:  R knee- TTP over L lateral plica of femur; no effusion or edema, no jt line ttp Neuro:  Sensation intact to pinprick on feet, gait nml Psych: Age appropriate judgment and  insight  Assessment:   Diabetes mellitus type 2 in obese (HCC) - Plan: metFORMIN (GLUCOPHAGE-XR) 500 MG 24 hr tablet, Dulaglutide (TRULICITY) 1.5 MG/0.5ML SOPN, Ambulatory referral to Ophthalmology, Comprehensive metabolic panel, Hemoglobin A1c, Microalbumin / creatinine urine ratio, Lipid panel  Essential hypertension - Plan: hydrochlorothiazide (HYDRODIURIL) 25 MG tablet  Tobacco abuse - Plan: CHANTIX 1 MG tablet  Encounter for screening mammogram for malignant neoplasm of breast - Plan: MM DIGITAL SCREENING BILATERAL  Plica syndrome, right knee   Plan:   1- orders as above. Take insulin nightly, if fasting AM sugars >150, increase to 15 u qhs. She will let me know how things go following that.  2- Recheck still high. She reports WCS. Has been out of HCTZ. Will monitor home bp's.  3- Refill Chantix. She is very motivated at this time, will quit w husband.  4- Mammogram due.  5- Ice, Tylenol, stretches/exercises. Could trial injection.  F/u in 3-6 mo. The patient voiced understanding and agreement to the plan.  [place], DO 08/28/19 9:11 AM

## 2019-08-28 NOTE — Patient Instructions (Addendum)
Give [place] 2-3 business days to get the results of your labs back.   Keep the diet clean and stay active.  Take your insulin nightly. If your sugars are staying above 150 in the morning, increase to 15 units nightly. If it is still above that level, send me a message.   Ice/cold pack over area for 10-15 min twice daily.  OK to take Tylenol 1000 mg (2 extra strength tabs) or 975 mg (3 regular strength tabs) every 6 hours as needed.  Let us know if you need anything.  Knee Exercises It is normal to feel mild stretching, pulling, tightness, or discomfort as you do these exercises, but you should stop right away if you feel sudden pain or your pain gets worse. STRETCHING AND RANGE OF MOTION EXERCISES  These exercises warm up your muscles and joints and improve the movement and flexibility of your knee. These exercises also help to relieve pain, numbness, and tingling. Exercise A: Knee Extension, Prone  1. Lie on your abdomen on a bed. 2. Place your left / right knee just beyond the edge of the surface so your knee is not on the bed. You can put a towel under your left / right thigh just above your knee for comfort. 3. Relax your leg muscles and allow gravity to straighten your knee. You should feel a stretch behind your left / right knee. 4. Hold this position for 30 seconds. 5. Scoot up so your knee is supported between repetitions. Repeat 2 times. Complete this stretch 3 times per week. Exercise B: Knee Flexion, Active    1. Lie on your back with both knees straight. If this causes back discomfort, bend your left / right knee so your foot is flat on the floor. 2. Slowly slide your left / right heel back toward your buttocks until you feel a gentle stretch in the front of your knee or thigh. 3. Hold this position for 30 seconds. 4. Slowly slide your left / right heel back to the starting position. Repeat 2 times. Complete this exercise 3 times per week. Exercise C: Quadriceps, Prone     1. Lie on your abdomen on a firm surface, such as a bed or padded floor. 2. Bend your left / right knee and hold your ankle. If you cannot reach your ankle or pant leg, loop a belt around your foot and grab the belt instead. 3. Gently pull your heel toward your buttocks. Your knee should not slide out to the side. You should feel a stretch in the front of your thigh and knee. 4. Hold this position for 30 seconds. Repeat 2 times. Complete this stretch 3 times per week. Exercise D: Hamstring, Supine  1. Lie on your back. 2. Loop a belt or towel over the ball of your left / right foot. The ball of your foot is on the walking surface, right under your toes. 3. Straighten your left / right knee and slowly pull on the belt to raise your leg until you feel a gentle stretch behind your knee. ? Do not let your left / right knee bend while you do this. ? Keep your other leg flat on the floor. 4. Hold this position for 30 seconds. Repeat 2 times. Complete this stretch 3 times per week. STRENGTHENING EXERCISES  These exercises build strength and endurance in your knee. Endurance is the ability to use your muscles for a long time, even after they get tired. Exercise E: Quadriceps, Isometric  1. Lie on your back with your left / right leg extended and your other knee bent. Put a rolled towel or small pillow under your knee if told by your health care provider. 2. Slowly tense the muscles in the front of your left / right thigh. You should see your kneecap slide up toward your hip or see increased dimpling just above the knee. This motion will push the back of the knee toward the floor. 3. For 3 seconds, keep the muscle as tight as you can without increasing your pain. 4. Relax the muscles slowly and completely. Repeat for 10 total reps Repeat 2 ti mes. Complete this exercise 3 times per week. Exercise F: Straight Leg Raises - Quadriceps  1. Lie on your back with your left / right leg extended and  your other knee bent. 2. Tense the muscles in the front of your left / right thigh. You should see your kneecap slide up or see increased dimpling just above the knee. Your thigh may even shake a bit. 3. Keep these muscles tight as you raise your leg 4-6 inches (10-15 cm) off the floor. Do not let your knee bend. 4. Hold this position for 3 seconds. 5. Keep these muscles tense as you lower your leg. 6. Relax your muscles slowly and completely after each repetition. 10 total reps. Repeat 2 times. Complete this exercise 3 times per week.  Exercise G: Hamstring Curls    If told by your health care provider, do this exercise while wearing ankle weights. Begin with 5 lb weights (optional). Then increase the weight by 1 lb (0.5 kg) increments. Do not wear ankle weights that are more than 20 lbs to start with. 1. Lie on your abdomen with your legs straight. 2. Bend your left / right knee as far as you can without feeling pain. Keep your hips flat against the floor. 3. Hold this position for 3 seconds. 4. Slowly lower your leg to the starting position. Repeat for 10 reps.  Repeat 2 times. Complete this exercise 3 times per week. Exercise H: Squats (Quadriceps)  1. Stand in front of a table, with your feet and knees pointing straight ahead. You may rest your hands on the table for balance but not for support. 2. Slowly bend your knees and lower your hips like you are going to sit in a chair. ? Keep your weight over your heels, not over your toes. ? Keep your lower legs upright so they are parallel with the table legs. ? Do not let your hips go lower than your knees. ? Do not bend lower than told by your health care provider. ? If your knee pain increases, do not bend as low. 3. Hold the squat position for 1 second. 4. Slowly push with your legs to return to standing. Do not use your hands to pull yourself to standing. Repeat 2 times. Complete this exercise 3 times per week. Exercise I: Wall  Slides (Quadriceps)    1. Lean your back against a smooth wall or door while you walk your feet out 18-24 inches (46-61 cm) from it. 2. Place your feet hip-width apart. 3. Slowly slide down the wall or door until your knees Repeat 2 times. Complete this exercise every other day. 4. Exercise K: Straight Leg Raises - Hip Abductors  1. Lie on your side with your left / right leg in the top position. Lie so your head, shoulder, knee, and hip line up. You may bend your  bottom knee to help you keep your balance. 2. Roll your hips slightly forward so your hips are stacked directly over each other and your left / right knee is facing forward. 3. Leading with your heel, lift your top leg 4-6 inches (10-15 cm). You should feel the muscles in your outer hip lifting. ? Do not let your foot drift forward. ? Do not let your knee roll toward the ceiling. 4. Hold this position for 3 seconds. 5. Slowly return your leg to the starting position. 6. Let your muscles relax completely after each repetition. 10 total reps. Repeat 2 times. Complete this exercise 3 times per week. Exercise J: Straight Leg Raises - Hip Extensors  1. Lie on your abdomen on a firm surface. You can put a pillow under your hips if that is more comfortable. 2. Tense the muscles in your buttocks and lift your left / right leg about 4-6 inches (10-15 cm). Keep your knee straight as you lift your leg. 3. Hold this position for 3 seconds. 4. Slowly lower your leg to the starting position. 5. Let your leg relax completely after each repetition. Repeat 2 times. Complete this exercise 3 times per week. Document Released: 12/29/2004 Document Revised: 11/09/2015 Document Reviewed: 12/21/2014 Elsevier Interactive Patient Education  2017 [employer].

## 2019-09-09 IMAGING — US US ABDOMEN LIMITED
1 series · 14 of 25 positions shown · non-contrast
Comparison: Abdominal ultrasound August 05, 2016

CLINICAL DATA: Elevated liver enzymes

EXAM:
ULTRASOUND ABDOMEN LIMITED RIGHT UPPER QUADRANT

[Series 1: us abdomen limited · 0.21mm/px · 14 of 43 slices shown]
[im 1/43]
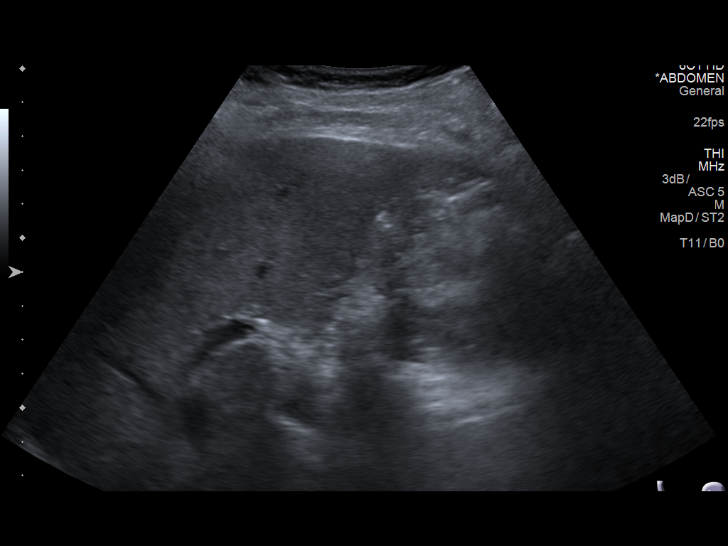
[im 4/43]
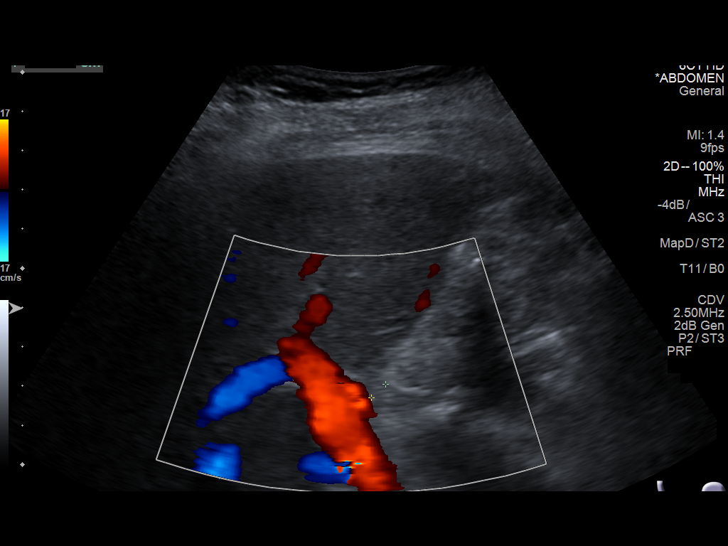
[im 8/43]
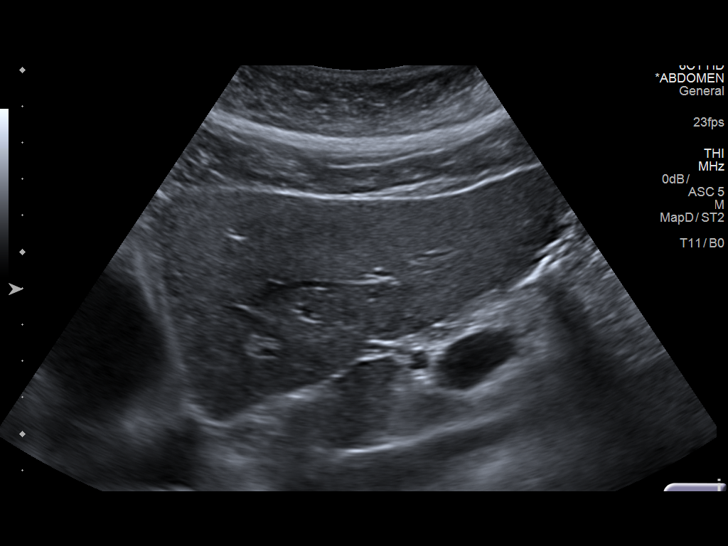
[im 11/43]
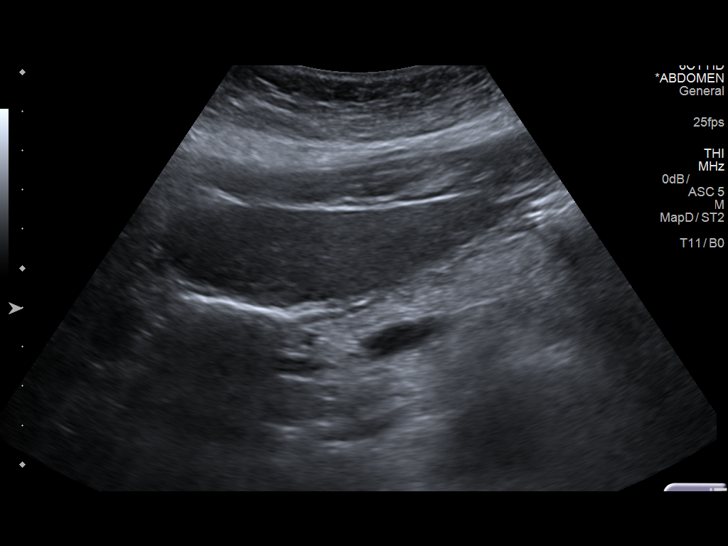
[im 15/43]
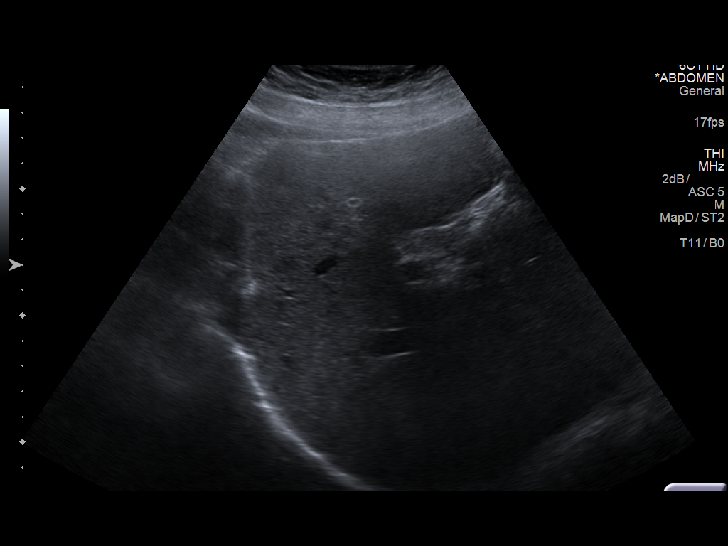
[im 16/43]
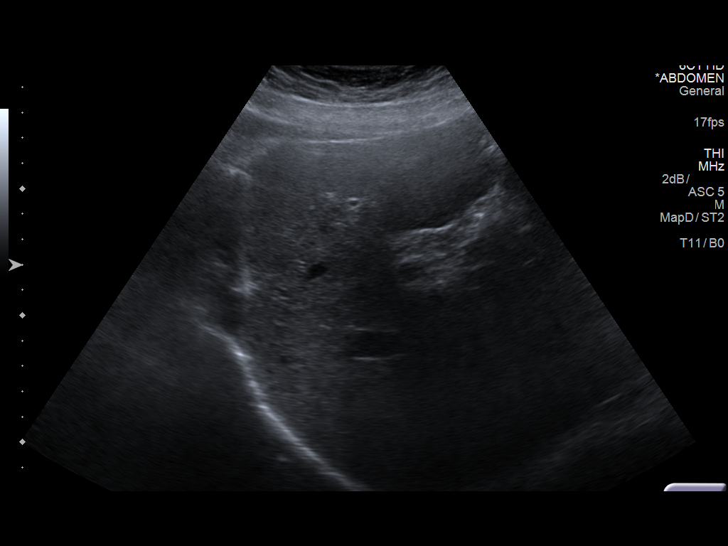
[im 20/43]
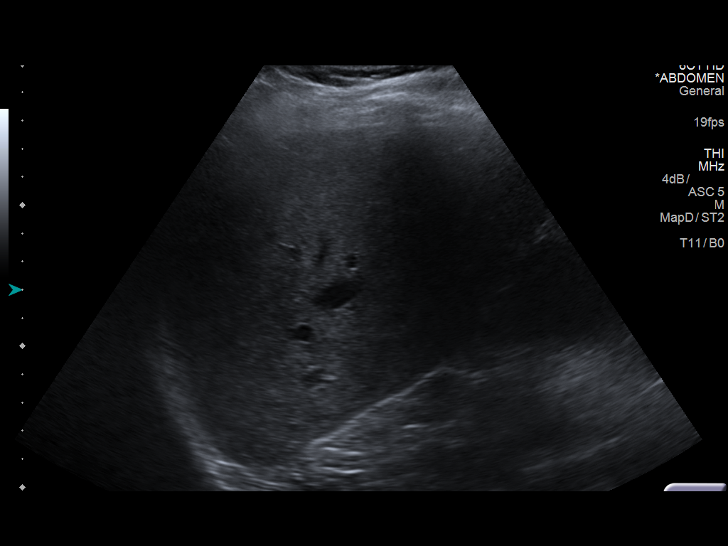
[im 23/43]
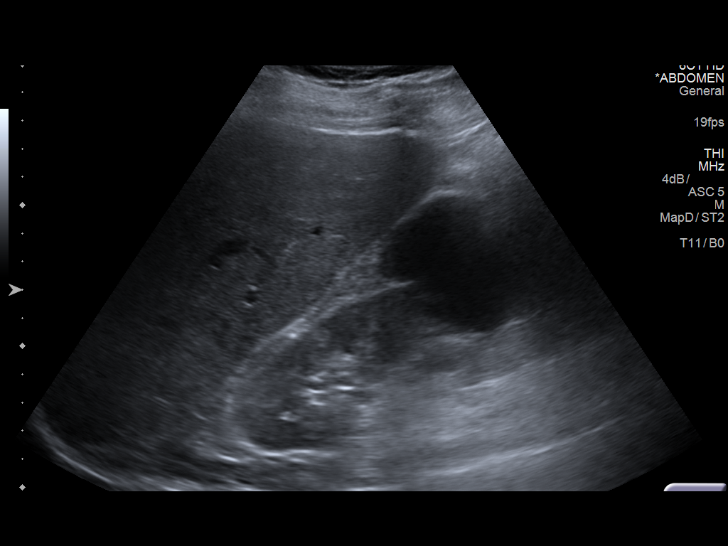
[im 27/43]
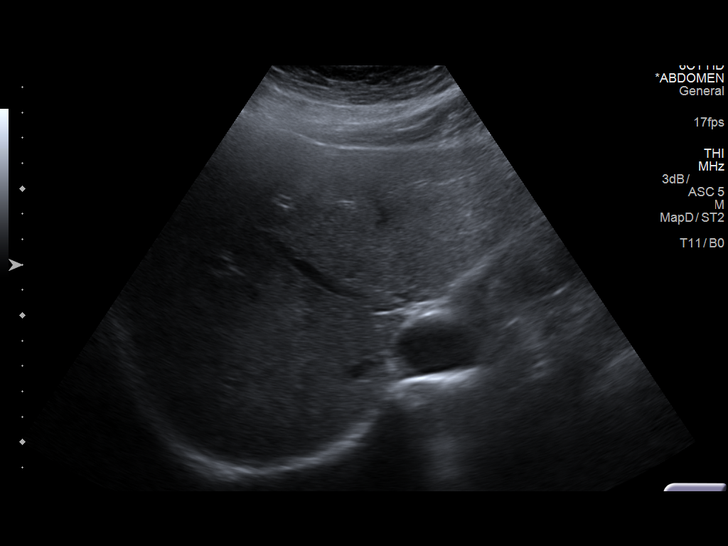
[im 29/43]
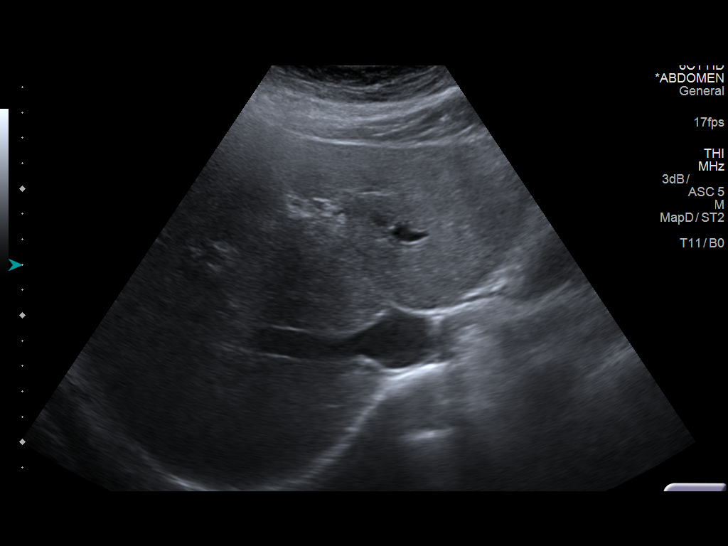
[im 32/43]
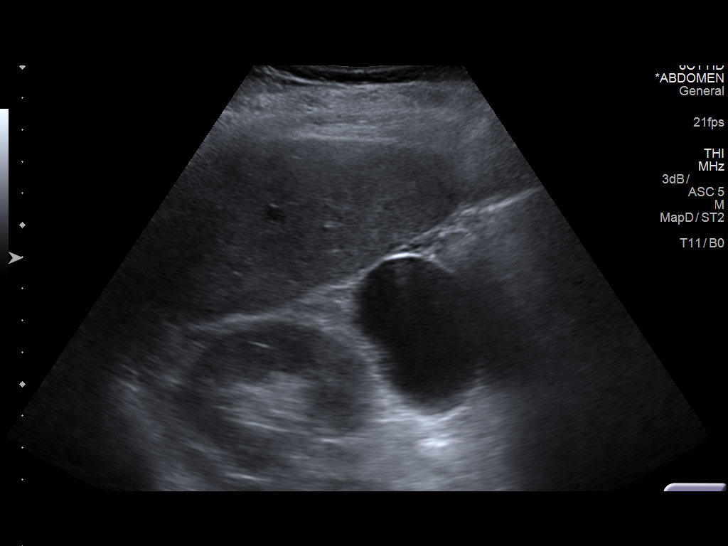
[im 36/43]
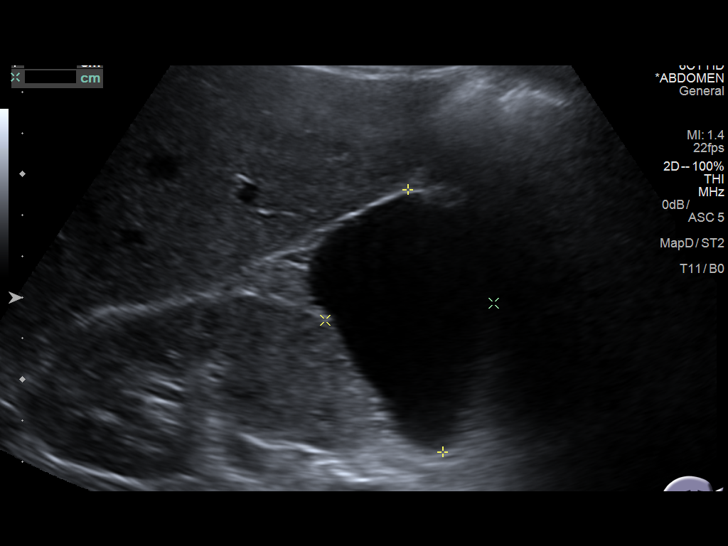
[im 39/43]
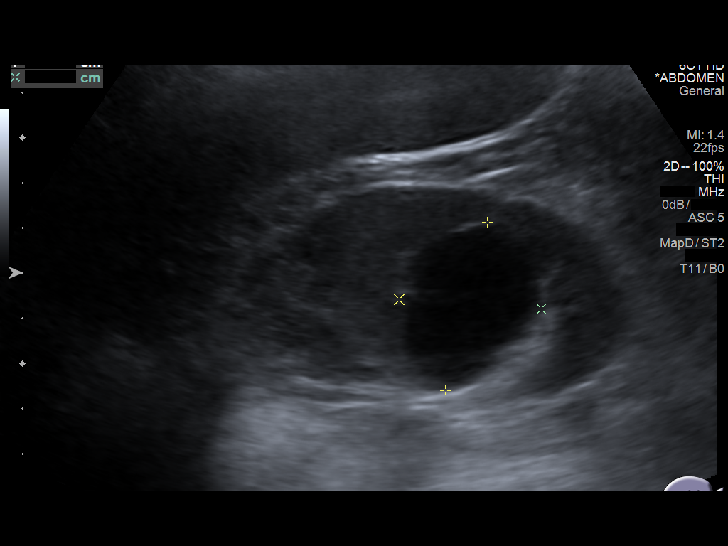
[im 43/43]
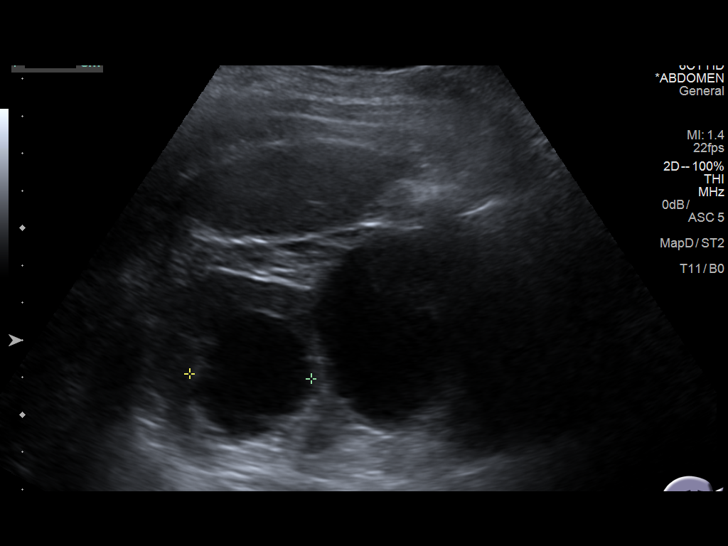

[14 of 25 positions shown; findings below may reference images not displayed]

FINDINGS: Gallbladder:

Surgically absent.

Common bile duct:

Diameter: 5 mm. No intrahepatic or extrahepatic biliary duct
dilatation.

Liver:

No focal lesion identified. Within normal limits in parenchymal
echogenicity. Portal vein is patent on color Doppler imaging with
normal direction of blood flow towards the liver.

There is a cyst in the mid to lower pole right kidney measuring
x 4.1 x 3.7 cm. A second cyst measures 3.8 x 3.2 x 3.3 cm in the
lower pole right kidney.
IMPRESSION: Gallbladder absent.

Prominent right renal cysts.

Study otherwise unremarkable.

## 2019-09-17 ENCOUNTER — Other Ambulatory Visit: Payer: Self-pay

## 2019-09-18 MED ORDER — BD PEN NEEDLE NANO U/F 32G X 4 MM MISC: 0 refills | Status: DC

## 2019-09-19 ENCOUNTER — Ambulatory Visit (HOSPITAL_BASED_OUTPATIENT_CLINIC_OR_DEPARTMENT_OTHER)
Admission: RE | Admit: 2019-09-19 | Discharge: 2019-09-19 | Disposition: A | Discharge status: Home / Self Care | Payer: BC Managed Care – PPO | Source: Ambulatory Visit | Attending: Family Medicine | Admitting: Family Medicine

## 2019-09-19 ENCOUNTER — Other Ambulatory Visit: Payer: Self-pay

## 2019-09-19 DIAGNOSIS — Z1231 Encounter for screening mammogram for malignant neoplasm of breast: Secondary | ICD-10-CM

## 2019-09-23 MED ORDER — ACCU-CHEK SOFTCLIX LANCET DEV KIT: PACK | 12 refills | Status: AC

## 2019-09-23 MED ORDER — ACCU-CHEK AVIVA VI SOLN: 3 refills | Status: AC

## 2019-09-23 MED ORDER — ACCU-CHEK AVIVA PLUS VI STRP: ORAL_STRIP | 12 refills | Status: DC

## 2019-09-23 MED ORDER — ACCU-CHEK AVIVA PLUS W/DEVICE KIT: PACK | 0 refills | Status: DC

## 2019-09-24 MED ORDER — ACCU-CHEK GUIDE ME W/DEVICE KIT: PACK | 0 refills | Status: AC

## 2019-09-24 MED ORDER — ACCU-CHEK GUIDE VI STRP: ORAL_STRIP | 2 refills | Status: AC

## 2019-09-24 NOTE — Addendum Note (Signed)
Addended by: Mervin Kung A on: 09/24/2019 12:10 PM   Modules accepted: Orders

## 2019-09-24 NOTE — Telephone Encounter (Signed)
Received fax from [facility] that aviva plus has been discontinued and to send Accu check guide test strips and meter RX. Rxs sent.

## 2019-10-24 LAB — HM DIABETES EYE EXAM

## 2019-10-27 DIAGNOSIS — I10 Essential (primary) hypertension: Secondary | ICD-10-CM

## 2019-10-28 MED ORDER — LISINOPRIL 40 MG PO TABS: ORAL_TABLET | ORAL | 0 refills | Status: DC

## 2019-11-20 ENCOUNTER — Other Ambulatory Visit: Payer: Self-pay | Admitting: Family Medicine

## 2019-11-20 DIAGNOSIS — E119 Type 2 diabetes mellitus without complications: Secondary | ICD-10-CM

## 2019-12-02 ENCOUNTER — Ambulatory Visit: Payer: BC Managed Care – PPO | Admitting: Family Medicine

## 2019-12-11 ENCOUNTER — Other Ambulatory Visit: Payer: Self-pay | Admitting: Family Medicine

## 2019-12-11 DIAGNOSIS — I10 Essential (primary) hypertension: Secondary | ICD-10-CM

## 2019-12-13 ENCOUNTER — Encounter: Payer: Self-pay | Admitting: Family Medicine

## 2019-12-13 ENCOUNTER — Other Ambulatory Visit: Payer: Self-pay

## 2019-12-13 ENCOUNTER — Ambulatory Visit: Payer: BC Managed Care – PPO | Admitting: Family Medicine

## 2019-12-13 VITALS — BP 140/88 | HR 83 | Temp 97.9°F | Ht 66.5 in | Wt 211.1 lb

## 2019-12-13 DIAGNOSIS — E1169 Type 2 diabetes mellitus with other specified complication: Secondary | ICD-10-CM | POA: Diagnosis not present

## 2019-12-13 DIAGNOSIS — G8929 Other chronic pain: Secondary | ICD-10-CM | POA: Diagnosis not present

## 2019-12-13 DIAGNOSIS — R0789 Other chest pain: Secondary | ICD-10-CM | POA: Diagnosis not present

## 2019-12-13 DIAGNOSIS — Z720 Tobacco use: Secondary | ICD-10-CM

## 2019-12-13 DIAGNOSIS — M25561 Pain in right knee: Secondary | ICD-10-CM

## 2019-12-13 DIAGNOSIS — I10 Essential (primary) hypertension: Secondary | ICD-10-CM | POA: Diagnosis not present

## 2019-12-13 DIAGNOSIS — E669 Obesity, unspecified: Secondary | ICD-10-CM

## 2019-12-13 MED ORDER — METHYLPREDNISOLONE ACETATE 40 MG/ML IJ SUSP
40.0000 mg | Freq: Once | INTRAMUSCULAR | Status: AC
  Administered 2019-12-13: 40 mg via INTRA_ARTICULAR

## 2019-12-13 MED ORDER — ALBUTEROL SULFATE HFA 108 (90 BASE) MCG/ACT IN AERS: 2.0000 | INHALATION_SPRAY | Freq: Four times a day (QID) | RESPIRATORY_TRACT | 0 refills | Status: DC | PRN

## 2019-12-13 MED ORDER — TOUJEO SOLOSTAR 300 UNIT/ML ~~LOC~~ SOPN: 15.0000 [IU] | PEN_INJECTOR | Freq: Every evening | SUBCUTANEOUS | 5 refills | Status: DC

## 2019-12-13 MED ORDER — BUPROPION HCL ER (SMOKING DET) 150 MG PO TB12: ORAL_TABLET | ORAL | 2 refills | Status: DC

## 2019-12-13 NOTE — Patient Instructions (Addendum)
Give [place] 2-3 business days to get the results of your labs back.   Keep the diet clean and stay active.  Monitor blood pressure at home.   Your EKG looks normal other than some changes that could be related to smoking.   Let us know if you need anything.

## 2019-12-13 NOTE — Progress Notes (Signed)
Subjective:   Chief Complaint  Patient presents with  . Follow-up    3 month    Patricia Shelton is a 59 y.o. female here for follow-up of diabetes.   Patricia Shelton's self monitored glucose range is low 100's. Patient denies hypoglycemic reactions. She checks her glucose levels 2 times per day. Patient does require insulin- Toujeo 15 u qhs.   Medications include: Metformin XR 500 mg/d, Trulicity 1.5 mg/week Diet is healthy overall.  Exercise: active at work  Knee pain Patient has a history of chronic right knee pain. The last time she was here, it was painful but not bothering her enough to do anything further. She is interested in injections today. She has never had one before. No films of her right knee either. No injury or change in activity. No bruising, redness, swelling, catching/locking, or excessive warmth.  Chest pain Duration of issue: 1 day Quality: dull Palliation: None Provocation: Was working when it started Radiation: None Duration of chest pain: 1 hour Associated symptoms: None Cardiac history: HTN Family heart history: No Smoker? Yes  Hypertension Patient presents for hypertension follow up. She does monitor home blood pressures. Blood pressures ranging on average from 140's/90's. She is compliant with medications. Patient has these side effects of medication: none Diet and exercise as above.  Tobacco abuse Patient smokes 1.5 packs/day. Chantix was voluntarily recalled due to contamination. She had mild success with this. Her husband also smokes. The last few years, she has noticed worsening breathing. When she tries to quit, her husband is not in the quitting phase and vice versa. She would like to try something else.  Past Medical History:  Diagnosis Date  . Diabetes mellitus without complication (HCC)   . High cholesterol   . History of chicken pox   . Hypertension   . Obesity   . Tobacco abuse      Related testing: Retinal exam: Done Pneumovax:  done  Objective:  BP 140/88 (BP Location: Left Arm, Patient Position: Sitting, Cuff Size: Normal)   Pulse 83   Temp 97.9 F (36.6 C) (Oral)   Ht 5' 6.5" (1.689 m)   Wt 211 lb 2 oz (95.8 kg)   SpO2 96%   BMI 33.57 kg/m  General:  Well developed, well nourished, in no apparent distress Skin:  Warm, no pallor or diaphoresis Head:  Normocephalic, atraumatic Eyes:  Pupils equal and round, sclera anicteric without injection  Lungs:  CTAB, no access msc use Cardio:  RRR, no bruits, no LE edema Musculoskeletal:  Symmetrical muscle groups noted without atrophy or deformity Neuro:  Sensation intact to pinprick on feet Psych: Age appropriate judgment and insight  Procedure Note; Knee injection Verbal consent obtained. The area of the antero-lateral joint line was palpated and cleaned with alcohol x1. A 27-gauge needle was used to enter the joint space anterolaterally with ease. 40 mg of Depomedrol with 2 mL of 1% lidocaine was injected. A Band-Aid was placed. The patient tolerated the procedure well. There were no complications noted.  Assessment:   Type 2 diabetes mellitus with obesity (HCC) - Plan: insulin glargine, 1 Unit Dial, (TOUJEO SOLOSTAR) 300 UNIT/ML Solostar Pen, Hemoglobin A1c, Lipid panel  Chronic pain of right knee - Plan: PR DRAIN/INJECT LARGE JOINT/BURSA, methylPREDNISolone acetate (DEPO-MEDROL) injection 40 mg  Atypical chest pain - Plan: EKG 12-Lead  Primary hypertension  Tobacco abuse - Plan: buPROPion (ZYBAN) 150 MG 12 hr tablet, albuterol (VENTOLIN HFA) 108 (90 Base) MCG/ACT inhaler   Plan:   1.  Cont Metformin, Trulicity and Toujeo as listed above. Counseled on diet and exercise. 2. Jt injection today. Pt reports improvement after injection. If it fails, will ck XR and refer to ortho.  3. Atypical. EKG: Low voltage, NSR, nml axis, no interval abn, no T wave changes, no ST segment changes. Will trial SABA as she likely has COPD.  4. Pt would like to focus on  wt loss/dietary improvement rather than medication. I will see her in 1 mo to reck this. 5. Zyban. Reck in 1 mo.  F/u in 1 mo. The patient voiced understanding and agreement to the plan.  Jilda Roche [place], DO 12/13/19 12:03 PM

## 2019-12-14 LAB — LIPID PANEL
Cholesterol: 196 mg/dL (ref <200)
HDL: 55 mg/dL (ref >=50)
LDL Cholesterol (Calc): 118 mg/dL (calc) — ABNORMAL HIGH
Non-HDL Cholesterol (Calc): 141 mg/dL (calc) — ABNORMAL HIGH (ref <130)
Total CHOL/HDL Ratio: 3.6 (calc) (ref <5.0)
Triglycerides: 122 mg/dL (ref <150)

## 2019-12-14 LAB — HEMOGLOBIN A1C
Hgb A1c MFr Bld: 7.2 % of total Hgb — ABNORMAL HIGH (ref <5.7)
Mean Plasma Glucose: 160 (calc)
eAG (mmol/L): 8.9 (calc)

## 2019-12-20 ENCOUNTER — Encounter: Payer: Self-pay | Admitting: Family Medicine

## 2019-12-20 ENCOUNTER — Ambulatory Visit: Payer: BC Managed Care – PPO | Admitting: Family Medicine

## 2019-12-20 ENCOUNTER — Other Ambulatory Visit: Payer: Self-pay

## 2019-12-20 ENCOUNTER — Ambulatory Visit (HOSPITAL_BASED_OUTPATIENT_CLINIC_OR_DEPARTMENT_OTHER)
Admission: RE | Admit: 2019-12-20 | Discharge: 2019-12-20 | Disposition: A | Discharge status: Home / Self Care | Payer: BC Managed Care – PPO | Source: Ambulatory Visit | Attending: Family Medicine | Admitting: Family Medicine

## 2019-12-20 VITALS — BP 122/78 | HR 90 | Temp 97.9°F | Ht 66.5 in | Wt 209.0 lb

## 2019-12-20 DIAGNOSIS — S99922A Unspecified injury of left foot, initial encounter: Secondary | ICD-10-CM | POA: Insufficient documentation

## 2019-12-20 DIAGNOSIS — Z720 Tobacco use: Secondary | ICD-10-CM | POA: Diagnosis not present

## 2019-12-20 MED ORDER — TRAMADOL HCL 50 MG PO TABS: 50.0000 mg | ORAL_TABLET | Freq: Three times a day (TID) | ORAL | 0 refills | Status: AC | PRN

## 2019-12-20 MED ORDER — BUPROPION HCL ER (SMOKING DET) 150 MG PO TB12: ORAL_TABLET | ORAL | 2 refills | Status: DC

## 2019-12-20 MED ORDER — ALBUTEROL SULFATE HFA 108 (90 BASE) MCG/ACT IN AERS: 2.0000 | INHALATION_SPRAY | Freq: Four times a day (QID) | RESPIRATORY_TRACT | 0 refills | Status: DC | PRN

## 2019-12-20 NOTE — Patient Instructions (Addendum)
Ice/cold pack over area for 10-15 min twice daily.  OK to take Tylenol 1000 mg (2 extra strength tabs) or 975 mg (3 regular strength tabs) every 6 hours as needed.  Ice/cold pack over area for 10-15 min twice daily.  Do not drink alcohol, do any illicit/street drugs, drive or do anything that requires alertness while on this medicine.   Wear the flat soled shoe when you are bearing weight and walking.  Let us know if you need anything.
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
## 2020-01-17 ENCOUNTER — Other Ambulatory Visit: Payer: Self-pay | Admitting: Family Medicine

## 2020-01-24 ENCOUNTER — Other Ambulatory Visit: Payer: Self-pay | Admitting: Family Medicine

## 2020-02-03 ENCOUNTER — Other Ambulatory Visit: Payer: Self-pay | Admitting: Family Medicine

## 2020-02-03 DIAGNOSIS — I10 Essential (primary) hypertension: Secondary | ICD-10-CM

## 2020-02-04 ENCOUNTER — Other Ambulatory Visit: Payer: Self-pay | Admitting: Family Medicine

## 2020-02-04 DIAGNOSIS — Z720 Tobacco use: Secondary | ICD-10-CM

## 2020-02-07 ENCOUNTER — Encounter: Payer: Self-pay | Admitting: Family Medicine

## 2020-02-07 ENCOUNTER — Other Ambulatory Visit: Payer: Self-pay

## 2020-02-07 ENCOUNTER — Ambulatory Visit: Payer: BC Managed Care – PPO | Admitting: Family Medicine

## 2020-02-07 VITALS — BP 122/78 | HR 97 | Temp 98.1°F | Ht 66.0 in | Wt 205.1 lb

## 2020-02-07 DIAGNOSIS — M25561 Pain in right knee: Secondary | ICD-10-CM | POA: Diagnosis not present

## 2020-02-07 DIAGNOSIS — G8929 Other chronic pain: Secondary | ICD-10-CM | POA: Diagnosis not present

## 2020-02-07 MED ORDER — METHYLPREDNISOLONE ACETATE 40 MG/ML IJ SUSP
40.0000 mg | Freq: Once | INTRAMUSCULAR | Status: AC
  Administered 2020-02-07: 40 mg via INTRA_ARTICULAR

## 2020-02-07 NOTE — Progress Notes (Signed)
Chief Complaint  Patient presents with  . Knee Pain    Subjective: Patient is a 59 y.o. female here for R knee pain.  Seen around 8 weeks ago and given a steroid injection that removed her pain. 5 d ago, it came back. She is very busy at work and cannot take much time off now. She was given stretches/exercises and has been compliant. No new bruising, swelling, redness, neurologic s/s's. No catching/locking.   Past Medical History:  Diagnosis Date  . Diabetes mellitus without complication (HCC)   . High cholesterol   . History of chicken pox   . Hypertension   . Obesity   . Tobacco abuse     Objective: BP 122/78 (BP Location: Left Arm, Patient Position: Sitting, Cuff Size: Normal)   Pulse 97   Temp 98.1 F (36.7 C) (Oral)   Ht 5\' 6"  (1.676 m)   Wt 205 lb 2 oz (93 kg)   SpO2 95%   BMI 33.11 kg/m  General: Awake, appears stated age MSK: R knee: Mild ttp over lateral jt line, no effusion or soft tissue swelling. Neg pat app/grind, Stine's, McMurray's, varus/valgus, Lachman's.  Lungs: CTAB, no rales, wheezes or rhonchi. No accessory muscle use Psych: Age appropriate judgment and insight, normal affect and mood  Procedure Note; Knee injection Verbal consent obtained. The area of the antero-lateral joint line was palpated and cleaned with alcohol x1. A 27-gauge needle was used to enter the joint space anterolaterally with ease. 40 mg of Depomedrol with 2 mL of 1% lidocaine was injected. A bandaid was placed.  The patient tolerated the procedure well. There were no complications noted.  Assessment and Plan: Chronic pain of right knee - Plan: PR DRAIN/INJECT LARGE JOINT/BURSA  Injection today as it has been more than 6 weeks after first ever injection, should wait at least 3 mo between next injection moving forward. Will get XR and refer to ortho if it comes back before the 3 mo mark. Cont stretches/exercises.  The patient voiced understanding and agreement to the  plan.  [place], DO 02/07/20  9:29 AM

## 2020-02-07 NOTE — Addendum Note (Signed)
Addended by: Scharlene Gloss B on: 02/07/2020 10:15 AM   Modules accepted: Orders

## 2020-02-07 NOTE — Patient Instructions (Signed)
Continue the stretches/exercises.  Ice/cold pack over area for 10-15 min twice daily.  Heat (pad or rice pillow in microwave) over affected area, 10-15 minutes twice daily.   Let me know if we aren't turning the corner.   Let us know if you need anything.

## 2020-02-13 ENCOUNTER — Other Ambulatory Visit: Payer: Self-pay | Admitting: Family Medicine

## 2020-02-13 DIAGNOSIS — E669 Obesity, unspecified: Secondary | ICD-10-CM

## 2020-02-27 ENCOUNTER — Other Ambulatory Visit: Payer: Self-pay | Admitting: Family Medicine

## 2020-02-27 DIAGNOSIS — Z720 Tobacco use: Secondary | ICD-10-CM

## 2020-06-03 ENCOUNTER — Other Ambulatory Visit: Payer: Self-pay | Admitting: Family Medicine

## 2020-06-03 DIAGNOSIS — E1169 Type 2 diabetes mellitus with other specified complication: Secondary | ICD-10-CM

## 2020-06-16 ENCOUNTER — Ambulatory Visit: Payer: BC Managed Care – PPO | Admitting: Family Medicine

## 2020-06-16 ENCOUNTER — Encounter: Payer: Self-pay | Admitting: Family Medicine

## 2020-06-16 ENCOUNTER — Other Ambulatory Visit: Payer: Self-pay

## 2020-06-16 VITALS — BP 108/62 | HR 85 | Temp 98.0°F | Ht 66.5 in | Wt 192.5 lb

## 2020-06-16 DIAGNOSIS — E669 Obesity, unspecified: Secondary | ICD-10-CM

## 2020-06-16 DIAGNOSIS — I10 Essential (primary) hypertension: Secondary | ICD-10-CM | POA: Diagnosis not present

## 2020-06-16 DIAGNOSIS — M25561 Pain in right knee: Secondary | ICD-10-CM

## 2020-06-16 DIAGNOSIS — E1169 Type 2 diabetes mellitus with other specified complication: Secondary | ICD-10-CM | POA: Diagnosis not present

## 2020-06-16 DIAGNOSIS — G8929 Other chronic pain: Secondary | ICD-10-CM | POA: Diagnosis not present

## 2020-06-16 MED ORDER — METFORMIN HCL ER 500 MG PO TB24: 500.0000 mg | ORAL_TABLET | Freq: Every day | ORAL | 2 refills | Status: DC

## 2020-06-16 MED ORDER — LISINOPRIL 40 MG PO TABS: 40.0000 mg | ORAL_TABLET | Freq: Every day | ORAL | 2 refills | Status: DC

## 2020-06-16 MED ORDER — METHYLPREDNISOLONE ACETATE 40 MG/ML IJ SUSP
40.0000 mg | Freq: Once | INTRAMUSCULAR | Status: AC
  Administered 2020-06-16: 40 mg via INTRA_ARTICULAR

## 2020-06-16 MED ORDER — TRAMADOL HCL 50 MG PO TABS: 50.0000 mg | ORAL_TABLET | Freq: Two times a day (BID) | ORAL | 0 refills | Status: DC | PRN

## 2020-06-16 MED ORDER — HYDROCHLOROTHIAZIDE 25 MG PO TABS: ORAL_TABLET | ORAL | 2 refills | Status: DC

## 2020-06-16 MED ORDER — TOUJEO SOLOSTAR 300 UNIT/ML ~~LOC~~ SOPN
15.0000 [IU] | PEN_INJECTOR | Freq: Every evening | SUBCUTANEOUS | 5 refills | Status: DC
Start: 2020-06-16 — End: 2021-03-17

## 2020-06-16 NOTE — Patient Instructions (Signed)
Give us 2-3 business days to get the results of your labs back.   Keep the diet clean and stay active.  If you do not hear anything about your referral in the next 1-2 weeks, call our office and ask for an update.  Let us know if you need anything. 

## 2020-06-16 NOTE — Progress Notes (Signed)
Subjective:   Chief Complaint  Patient presents with  . Follow-up    Diabetes Right knee pain Refill Tramadol    Patricia Shelton is a 60 y.o. female here for follow-up of diabetes.   Patricia Shelton's self monitored glucose range is low 100's; low to mid 100's in the evenings.  Patient denies hypoglycemic reactions. She checks her glucose levels 1-2 time(s) per day. Patient does require insulin. She is on Toujeo 15 u/d   Medications include: Trulicity 1.5 mg/week, Metformin XR 500 mg/d Diet is healthy.  Exercise: none due to knee No CP or SOB.  Chronic right knee pain Several year history of chronic right knee pain.  Work has made it worse walking on concrete.  She has some swelling.  Had an injection in December lasted for around 3 months.  She is requesting to see an orthopedic surgeon.  Some catching/locking.  No other mechanical symptoms.  Denies any redness, bruising, recent trauma.  Hypertension Patient presents for hypertension follow up. She does monitor home blood pressures. Blood pressures ranging on average from 120's/80's. She is compliant with medications-lisinopril 40 mg daily, hydrochlorothiazide 25 mg daily. Patient has these side effects of medication: none Diet and exercise as above.   Past Medical History:  Diagnosis Date  . Diabetes mellitus without complication (HCC)   . High cholesterol   . History of chicken pox   . Hypertension   . Obesity   . Tobacco abuse      Related testing: Retinal exam: Done Pneumovax: done  Objective:  BP 108/62 (BP Location: Left Arm, Patient Position: Sitting, Cuff Size: Normal)   Pulse 85   Temp 98 F (36.7 C) (Oral)   Ht 5' 6.5" (1.689 m)   Wt 192 lb 8 oz (87.3 kg)   SpO2 95%   BMI 30.60 kg/m  General:  Well developed, well nourished, in no apparent distress Skin:  Warm, no pallor or diaphoresis Head:  Normocephalic, atraumatic Eyes:  Pupils equal and round, sclera anicteric without injection  Lungs:  CTAB, no access msc  use Cardio:  RRR, no bruits, no LE edema Neuro:  Antalgic gait Psych: Age appropriate judgment and insight  Procedure Note; Knee injection Verbal consent obtained. The area of the antero-lateral joint line was palpated and cleaned with alcohol x1. A 25-gauge needle was used to enter the joint space anterolaterally with ease. 40 mg of Depomedrol with 2 mL of 1% lidocaine was injected. A bandaid was placed.  The patient tolerated the procedure well. There were no complications noted.  Assessment:   Diabetes mellitus type 2 in obese (HCC) - Plan: insulin glargine, 1 Unit Dial, (TOUJEO SOLOSTAR) 300 UNIT/ML Solostar Pen, metFORMIN (GLUCOPHAGE-XR) 500 MG 24 hr tablet, CBC, Comprehensive metabolic panel, Lipid panel, Hemoglobin A1c, Microalbumin / creatinine urine ratio  Chronic pain of right knee - Plan: Ambulatory referral to Orthopedic Surgery, traMADol (ULTRAM) 50 MG tablet, PR DRAIN/INJECT LARGE JOINT/BURSA  Essential hypertension - Plan: hydrochlorothiazide (HYDRODIURIL) 25 MG tablet, lisinopril (ZESTRIL) 40 MG tablet   Plan:   1. Cont Toujeo 15 units nightly, metformin extended release 500 mg daily, Trulicity 1.5 mg weekly.  Check above levels.  Counseled on diet and exercise. 2.  Injection today.  Refer to orthopedic surgery per her request.  Refill tramadol. 3.  Continue hydrochlorothiazide 25 mg daily and lisinopril 40 mg daily.  No longer needs to check blood pressure at home. F/u in 6 mo for physical. The patient voiced understanding and agreement to the plan.  Patricia Roche [place], DO 06/16/20 4:43 PM

## 2020-06-17 LAB — LIPID PANEL
Cholesterol: 187 mg/dL (ref 0–200)
HDL: 55.1 mg/dL (ref >39.00)
LDL Cholesterol: 107 mg/dL — ABNORMAL HIGH (ref 0–99)
NonHDL: 131.89
Total CHOL/HDL Ratio: 3
Triglycerides: 122 mg/dL (ref 0.0–149.0)
VLDL: 24.4 mg/dL (ref 0.0–40.0)

## 2020-06-17 LAB — COMPREHENSIVE METABOLIC PANEL
ALT: 15 U/L (ref 0–35)
AST: 17 U/L (ref 0–37)
Albumin: 3.9 g/dL (ref 3.5–5.2)
Alkaline Phosphatase: 103 U/L (ref 39–117)
BUN: 17 mg/dL (ref 6–23)
CO2: 29 mEq/L (ref 19–32)
Calcium: 9.6 mg/dL (ref 8.4–10.5)
Chloride: 98 mEq/L (ref 96–112)
Creatinine, Ser: 1.06 mg/dL (ref 0.40–1.20)
GFR: 56.83 mL/min — ABNORMAL LOW (ref >60.00)
Glucose, Bld: 118 mg/dL — ABNORMAL HIGH (ref 70–99)
Potassium: 4.1 mEq/L (ref 3.5–5.1)
Sodium: 136 mEq/L (ref 135–145)
Total Bilirubin: 0.5 mg/dL (ref 0.2–1.2)
Total Protein: 6.7 g/dL (ref 6.0–8.3)

## 2020-06-17 LAB — CBC
HCT: 41.2 % (ref 36.0–46.0)
Hemoglobin: 13.9 g/dL (ref 12.0–15.0)
MCHC: 33.7 g/dL (ref 30.0–36.0)
MCV: 91.4 fl (ref 78.0–100.0)
Platelets: 238 10*3/uL (ref 150.0–400.0)
RBC: 4.5 Mil/uL (ref 3.87–5.11)
RDW: 14.4 % (ref 11.5–15.5)
WBC: 6.3 10*3/uL (ref 4.0–10.5)

## 2020-06-17 LAB — MICROALBUMIN / CREATININE URINE RATIO
Creatinine,U: 116.2 mg/dL
Microalb Creat Ratio: 1 mg/g (ref 0.0–30.0)
Microalb, Ur: 1.1 mg/dL (ref 0.0–1.9)

## 2020-06-17 LAB — HEMOGLOBIN A1C: Hgb A1c MFr Bld: 7.2 % — ABNORMAL HIGH (ref 4.6–6.5)

## 2020-06-19 ENCOUNTER — Telehealth: Payer: Self-pay | Admitting: *Deleted

## 2020-06-19 NOTE — Telephone Encounter (Signed)
KEY: [id number]  PRIOR AUTH STARTED AND AWAITING APPROVAL

## 2020-06-22 ENCOUNTER — Ambulatory Visit: Payer: BC Managed Care – PPO | Admitting: Orthopedic Surgery

## 2020-06-22 NOTE — Telephone Encounter (Signed)
PA approved via cover my meds. 06/19/2020-12/19/2020 coverage

## 2020-06-29 ENCOUNTER — Ambulatory Visit: Payer: Self-pay

## 2020-06-29 ENCOUNTER — Ambulatory Visit: Payer: BC Managed Care – PPO | Admitting: Physician Assistant

## 2020-06-29 ENCOUNTER — Encounter: Payer: Self-pay | Admitting: Orthopedic Surgery

## 2020-06-29 DIAGNOSIS — G8929 Other chronic pain: Secondary | ICD-10-CM

## 2020-06-29 DIAGNOSIS — M25561 Pain in right knee: Secondary | ICD-10-CM

## 2020-06-29 MED ORDER — MELOXICAM 15 MG PO TABS: 15.0000 mg | ORAL_TABLET | Freq: Every day | ORAL | 0 refills | Status: DC
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
## 2020-07-28 ENCOUNTER — Ambulatory Visit: Payer: BC Managed Care – PPO | Admitting: Orthopedic Surgery

## 2020-07-29 ENCOUNTER — Other Ambulatory Visit: Payer: Self-pay | Admitting: Physician Assistant

## 2020-07-30 ENCOUNTER — Other Ambulatory Visit: Payer: Self-pay | Admitting: Family Medicine

## 2020-07-30 DIAGNOSIS — E669 Obesity, unspecified: Secondary | ICD-10-CM

## 2020-08-25 ENCOUNTER — Other Ambulatory Visit: Payer: Self-pay | Admitting: Physician Assistant

## 2020-09-08 ENCOUNTER — Other Ambulatory Visit: Payer: Self-pay | Admitting: Family Medicine

## 2020-09-08 DIAGNOSIS — E1169 Type 2 diabetes mellitus with other specified complication: Secondary | ICD-10-CM

## 2020-10-06 ENCOUNTER — Other Ambulatory Visit: Payer: Self-pay | Admitting: Physician Assistant

## 2020-10-19 ENCOUNTER — Other Ambulatory Visit: Payer: Self-pay | Admitting: Physician Assistant

## 2020-10-20 ENCOUNTER — Other Ambulatory Visit: Payer: Self-pay | Admitting: Physician Assistant

## 2020-10-21 ENCOUNTER — Ambulatory Visit: Payer: 59 | Admitting: Family Medicine

## 2020-10-21 ENCOUNTER — Other Ambulatory Visit: Payer: Self-pay

## 2020-10-21 ENCOUNTER — Encounter: Payer: Self-pay | Admitting: Family Medicine

## 2020-10-21 VITALS — BP 128/76 | HR 114 | Temp 98.3°F | Ht 66.5 in | Wt 217.4 lb

## 2020-10-21 DIAGNOSIS — M25561 Pain in right knee: Secondary | ICD-10-CM | POA: Diagnosis not present

## 2020-10-21 DIAGNOSIS — G8929 Other chronic pain: Secondary | ICD-10-CM | POA: Diagnosis not present

## 2020-10-21 MED ORDER — METHYLPREDNISOLONE ACETATE 40 MG/ML IJ SUSP
40.0000 mg | Freq: Once | INTRAMUSCULAR | Status: AC
  Administered 2020-10-21: 40 mg via INTRA_ARTICULAR

## 2020-10-21 NOTE — Progress Notes (Signed)
Chief Complaint  Patient presents with   Knee Pain    Right     Subjective: Patient is a 60 y.o. female here for f/u chronic R knee pain.  Received injection around 4 mo ago that helped. Interested in another. Saw ortho who gave her HEP that is minimally helpful. No swelling, redness, bruising, neuro s/s's. Cannot walk w pain currently. No catching/locking.   Past Medical History:  Diagnosis Date   Diabetes mellitus without complication (HCC)    High cholesterol    History of chicken pox    Hypertension    Obesity    Tobacco abuse     Objective: BP 128/76   Pulse (!) 114   Temp 98.3 F (36.8 C) (Oral)   Ht 5' 6.5" (1.689 m)   Wt 217 lb 6 oz (98.6 kg)   SpO2 96%   BMI 34.56 kg/m  General: Awake, appears stated age MSK: No effusion of R knee, erythema, excessive warmth Neuro: Antalgic gait Lungs: No accessory muscle use Psych: Age appropriate judgment and insight, normal affect and mood  Procedure Note; Knee injection Verbal consent obtained. The area of the antero-lateral joint line was palpated and cleaned with alcohol x1. A 27-gauge needle was used to enter the joint space anterolaterally with ease. 40 mg of Depomedrol with 2 mL of 1% lidocaine was injected. A bandaid was placed.  The patient tolerated the procedure well. There were no complications noted.   Assessment and Plan: Chronic pain of right knee - Plan: methylPREDNISolone acetate (DEPO-MEDROL) injection 40 mg, PR DRAIN/INJECT LARGE JOINT/BURSA  Chronic, uncontrolled. Cont tramadol and Mobic prn. Cont HEP. Ice. Injection today. Appreciate ortho.  F/u as originally scheduled.  The patient voiced understanding and agreement to the plan.  Jilda Roche [place], DO 10/21/20  4:27 PM

## 2020-10-21 NOTE — Patient Instructions (Signed)
Ice/cold pack over area for 10-15 min twice daily.  Let me know if there are issues in the future with your medicine and we can get you some samples.   Let us know if you need anything.

## 2020-11-19 ENCOUNTER — Other Ambulatory Visit: Payer: Self-pay | Admitting: Physician Assistant

## 2020-12-22 ENCOUNTER — Telehealth: Payer: Self-pay | Admitting: Family Medicine

## 2020-12-22 MED ORDER — MELOXICAM 15 MG PO TABS: ORAL_TABLET | ORAL | 0 refills | Status: DC

## 2020-12-22 NOTE — Telephone Encounter (Signed)
Medication:  meloxicam (MOBIC) 15 MG tablet    Has the patient contacted their pharmacy? No. (If no, request that the patient contact the pharmacy for the refill.) (If yes, when and what did the pharmacy advise?)    Preferred Pharmacy (with phone number or street name):  [id number] Rosalita Levan, [place] - [address] AT [facility] [address]  [address] [place], [place] [address]  Phone:  [phone]  Fax:  [phone]

## 2021-01-11 ENCOUNTER — Other Ambulatory Visit: Payer: Self-pay | Admitting: Family Medicine

## 2021-01-11 DIAGNOSIS — E1169 Type 2 diabetes mellitus with other specified complication: Secondary | ICD-10-CM

## 2021-02-01 ENCOUNTER — Telehealth: Payer: Self-pay | Admitting: Family Medicine

## 2021-02-01 MED ORDER — MELOXICAM 15 MG PO TABS: ORAL_TABLET | ORAL | 0 refills | Status: DC

## 2021-02-01 NOTE — Telephone Encounter (Signed)
Medication: meloxicam (MOBIC) 15 MG tablet   Has the patient contacted their pharmacy? No. (If no, request that the patient contact the pharmacy for the refill.) (If yes, when and what did the pharmacy advise?)  Preferred Pharmacy (with phone number or street name):  [facility] #[id number] Rosalita Levan, [place] - [address] AT [facility] [address]  [address] [place], [place] [address]  Phone:  [phone]  Fax:  [phone]  Agent: Please be advised that RX refills may take up to 3 business days. We ask that you follow-up with your pharmacy.

## 2021-02-11 ENCOUNTER — Other Ambulatory Visit: Payer: Self-pay | Admitting: Family Medicine

## 2021-02-28 ENCOUNTER — Other Ambulatory Visit: Payer: Self-pay | Admitting: Family Medicine

## 2021-03-02 ENCOUNTER — Other Ambulatory Visit: Payer: Self-pay | Admitting: Family Medicine

## 2021-03-02 MED ORDER — MELOXICAM 15 MG PO TABS: 15.0000 mg | ORAL_TABLET | Freq: Every day | ORAL | 0 refills | Status: DC

## 2021-03-04 ENCOUNTER — Other Ambulatory Visit: Payer: Self-pay | Admitting: Family Medicine

## 2021-03-04 DIAGNOSIS — I10 Essential (primary) hypertension: Secondary | ICD-10-CM

## 2021-03-17 ENCOUNTER — Other Ambulatory Visit: Payer: Self-pay | Admitting: Family Medicine

## 2021-03-17 ENCOUNTER — Ambulatory Visit: Payer: 59 | Admitting: Family Medicine

## 2021-03-17 ENCOUNTER — Encounter: Payer: Self-pay | Admitting: Family Medicine

## 2021-03-17 VITALS — BP 140/92 | HR 96 | Temp 98.2°F | Ht 66.5 in | Wt 233.5 lb

## 2021-03-17 DIAGNOSIS — G8929 Other chronic pain: Secondary | ICD-10-CM | POA: Diagnosis not present

## 2021-03-17 DIAGNOSIS — K219 Gastro-esophageal reflux disease without esophagitis: Secondary | ICD-10-CM | POA: Diagnosis not present

## 2021-03-17 DIAGNOSIS — E1169 Type 2 diabetes mellitus with other specified complication: Secondary | ICD-10-CM | POA: Diagnosis not present

## 2021-03-17 DIAGNOSIS — E669 Obesity, unspecified: Secondary | ICD-10-CM | POA: Diagnosis not present

## 2021-03-17 DIAGNOSIS — M25561 Pain in right knee: Secondary | ICD-10-CM

## 2021-03-17 DIAGNOSIS — I10 Essential (primary) hypertension: Secondary | ICD-10-CM | POA: Diagnosis not present

## 2021-03-17 LAB — CBC
HCT: 43.5 % (ref 36.0–46.0)
Hemoglobin: 14.2 g/dL (ref 12.0–15.0)
MCHC: 32.6 g/dL (ref 30.0–36.0)
MCV: 93.5 fl (ref 78.0–100.0)
Platelets: 216 10*3/uL (ref 150.0–400.0)
RBC: 4.66 Mil/uL (ref 3.87–5.11)
RDW: 13.8 % (ref 11.5–15.5)
WBC: 5.7 10*3/uL (ref 4.0–10.5)

## 2021-03-17 LAB — COMPREHENSIVE METABOLIC PANEL
ALT: 22 U/L (ref 0–35)
AST: 20 U/L (ref 0–37)
Albumin: 4.1 g/dL (ref 3.5–5.2)
Alkaline Phosphatase: 168 U/L — ABNORMAL HIGH (ref 39–117)
BUN: 13 mg/dL (ref 6–23)
CO2: 28 mEq/L (ref 19–32)
Calcium: 9.6 mg/dL (ref 8.4–10.5)
Chloride: 100 mEq/L (ref 96–112)
Creatinine, Ser: 0.96 mg/dL (ref 0.40–1.20)
GFR: 63.67 mL/min (ref >60.00)
Glucose, Bld: 291 mg/dL — ABNORMAL HIGH (ref 70–99)
Potassium: 4.9 mEq/L (ref 3.5–5.1)
Sodium: 137 mEq/L (ref 135–145)
Total Bilirubin: 0.6 mg/dL (ref 0.2–1.2)
Total Protein: 6.6 g/dL (ref 6.0–8.3)

## 2021-03-17 LAB — LIPID PANEL
Cholesterol: 225 mg/dL — ABNORMAL HIGH (ref 0–200)
HDL: 62.7 mg/dL (ref >39.00)
LDL Cholesterol: 134 mg/dL — ABNORMAL HIGH (ref 0–99)
NonHDL: 161.85
Total CHOL/HDL Ratio: 4
Triglycerides: 138 mg/dL (ref 0.0–149.0)
VLDL: 27.6 mg/dL (ref 0.0–40.0)

## 2021-03-17 LAB — HEMOGLOBIN A1C: Hgb A1c MFr Bld: 10.4 % — ABNORMAL HIGH (ref 4.6–6.5)

## 2021-03-17 MED ORDER — METHYLPREDNISOLONE ACETATE 40 MG/ML IJ SUSP
40.0000 mg | Freq: Once | INTRAMUSCULAR | Status: AC
  Administered 2021-03-17: 40 mg via INTRA_ARTICULAR

## 2021-03-17 MED ORDER — TOUJEO SOLOSTAR 300 UNIT/ML ~~LOC~~ SOPN: 15.0000 [IU] | PEN_INJECTOR | Freq: Every evening | SUBCUTANEOUS | 5 refills | Status: DC

## 2021-03-17 MED ORDER — TRAMADOL HCL 50 MG PO TABS: 50.0000 mg | ORAL_TABLET | Freq: Two times a day (BID) | ORAL | 0 refills | Status: DC | PRN

## 2021-03-17 MED ORDER — METFORMIN HCL ER 500 MG PO TB24: ORAL_TABLET | ORAL | 2 refills | Status: DC

## 2021-03-17 MED ORDER — PANTOPRAZOLE SODIUM 40 MG PO TBEC: 40.0000 mg | DELAYED_RELEASE_TABLET | Freq: Every day | ORAL | 3 refills | Status: DC

## 2021-03-17 NOTE — Patient Instructions (Addendum)
The only lifestyle changes that have data behind them are weight loss for the overweight/obese and elevating the head of the bed. Finding out which foods/positions are triggers is important.  Give [place] 2-3 business days to get the results of your labs back.   Keep the diet clean and stay active.  Monitor your blood pressure at home.   Let us know if you need anything.

## 2021-03-17 NOTE — Progress Notes (Signed)
Chief Complaint  Patient presents with   Abdominal Pain    For 3 weeks Nausea    Knee Pain    right    Subjective: Patient is a 61 y.o. female here for abd pain.  3 weeks has been having irritation/cramping in her abd region. R side of abd pain, hurts when she has not gone to the bathroom or has overeaten. She is compliant w omeprazole and still getting GERD s/s's.   R knee pain Chronic, started to flare around 5 d ago. Started to spasm. No inj or change in activity. Cold weather came about.   Past Medical History:  Diagnosis Date   Diabetes mellitus without complication (HCC)    High cholesterol    History of chicken pox    Hypertension    Obesity    Tobacco abuse     Objective: BP (!) 140/92    Pulse 96    Temp 98.2 F (36.8 C) (Oral)    Ht 5' 6.5" (1.689 m)    Wt 233 lb 8 oz (105.9 kg)    SpO2 99%    BMI 37.12 kg/m  General: Awake, appears stated age Heart: RRR, no LE edema Lungs: CTAB, no rales, wheezes or rhonchi. No accessory muscle use Abd: BS+, S, NT, ND Psych: Age appropriate judgment and insight, normal affect and mood  Procedure Note; Knee injection Verbal consent obtained. The area of the antero-lateral joint line was palpated and cleaned with alcohol x1. A 25-gauge needle was used to enter the joint space anterolaterally with ease. 40 mg of Depomedrol with 2 mL of 1% lidocaine was injected. A Band-aid was placed.  The patient tolerated the procedure well. There were no complications noted.  Assessment and Plan: Gastroesophageal reflux disease, unspecified whether esophagitis present - Plan: pantoprazole (PROTONIX) 40 MG tablet  Essential hypertension  Chronic pain of right knee - Plan: methylPREDNISolone acetate (DEPO-MEDROL) injection 40 mg, PR DRAIN/INJECT LARGE JOINT/BURSA  Diabetes mellitus type 2 in obese (HCC) - Plan: metFORMIN (GLUCOPHAGE-XR) 500 MG 24 hr tablet, insulin glargine, 1 Unit Dial, (TOUJEO SOLOSTAR) 300 UNIT/ML Solostar Pen, CBC,  Comprehensive metabolic panel, Lipid panel, Hemoglobin A1c  Chronic, uncontrolled. Change Prilosec to Protonix. Take twice daily for 2 weeks and then daily. Reflux precautions verbalized and written down.  Monitor BP at home. Did not wish to change BP meds at this time. Injection today. They are working well as of the last one (5 months). Ck labs.  F/u in 1 mo for DM visit.  The patient voiced understanding and agreement to the plan.  [place], DO 03/17/21  8:32 AM

## 2021-03-17 NOTE — Telephone Encounter (Signed)
LAST ov TODAY 03/17/21 LAST RF--05/2020

## 2021-03-29 ENCOUNTER — Other Ambulatory Visit: Payer: Self-pay | Admitting: Family Medicine

## 2021-03-29 MED ORDER — MELOXICAM 15 MG PO TABS: 15.0000 mg | ORAL_TABLET | Freq: Every day | ORAL | 0 refills | Status: DC

## 2021-04-14 ENCOUNTER — Ambulatory Visit: Payer: 59 | Admitting: Family Medicine

## 2021-04-14 ENCOUNTER — Encounter: Payer: Self-pay | Admitting: Family Medicine

## 2021-04-14 VITALS — BP 162/100 | HR 96 | Temp 98.1°F | Ht 66.5 in | Wt 235.1 lb

## 2021-04-14 DIAGNOSIS — E1169 Type 2 diabetes mellitus with other specified complication: Secondary | ICD-10-CM

## 2021-04-14 DIAGNOSIS — E669 Obesity, unspecified: Secondary | ICD-10-CM | POA: Diagnosis not present

## 2021-04-14 DIAGNOSIS — I10 Essential (primary) hypertension: Secondary | ICD-10-CM

## 2021-04-14 DIAGNOSIS — K219 Gastro-esophageal reflux disease without esophagitis: Secondary | ICD-10-CM | POA: Diagnosis not present

## 2021-04-14 MED ORDER — AMLODIPINE BESYLATE 5 MG PO TABS: 5.0000 mg | ORAL_TABLET | Freq: Every day | ORAL | 3 refills | Status: DC

## 2021-04-14 NOTE — Patient Instructions (Addendum)
Keep an eye on position when your arm gets tingling.   Keep the diet clean and stay active.  No changes with your diabetes medication.   Check your blood pressures 2-3 times per week, alternating the time of day you check it. If it is high, considering waiting 1-2 minutes and rechecking. If it gets higher, your anxiety is likely creeping up and we should avoid rechecking.   Let me know if your sugars spike up again.   Let us know if you need anything.

## 2021-04-14 NOTE — Progress Notes (Signed)
Chief Complaint  Patient presents with   Follow-up    1 month BP and BS    Subjective Patricia Shelton is a 61 y.o. female who presents for hypertension follow up. She does monitor home blood pressures. Blood pressures ranging from 140's/90's on average. She is compliant with medications- lisinopril 40 mg/d, HCTZ 25 mg/d. Patient has these side effects of medication: none She is starting to adhere to a healthy diet overall. Current exercise: some walking No Cp or SOB.  DM- Compliance improved with insulin. Taking 15 u nightly to [place]. Compliant with Trulicity 1.5 mg weekly and Metformin XR 500 mg/d. Sugars running low 100's since cleaning up diet and using insulin routinely.   GERD- improving on Protonix 40 mg/d. Compliant, no AE's. Still having some intermittent burning in middle of the night, but this is improving.   Past Medical History:  Diagnosis Date   Diabetes mellitus without complication (HCC)    High cholesterol    History of chicken pox    Hypertension    Obesity    Tobacco abuse     Exam BP (!) 162/100    Pulse 96    Temp 98.1 F (36.7 C) (Oral)    Ht 5' 6.5" (1.689 m)    Wt 235 lb 2 oz (106.7 kg)    SpO2 99%    BMI 37.38 kg/m  General:  well developed, well nourished, in no apparent distress Heart: RRR, no bruits, no LE edema Lungs: clear to auscultation, no accessory muscle use Psych: well oriented with normal range of affect and appropriate judgment/insight  Diabetes mellitus type 2 in obese (HCC)  Essential hypertension - Plan: amLODipine (NORVASC) 5 MG tablet  Gastroesophageal reflux disease, unspecified whether esophagitis present  Chronic, this seems to be doing better.  Continue Toujeo 15 units nightly, metformin XR [id number] mg daily, Trulicity 1.5 mg weekly.  Continue to monitor sugars at home.  Counseled on diet and exercise.  Chronic, uncontrolled.  Continue lisinopril 40 mg daily, hydrochlorothiazide 25 mg daily.  Add amlodipine 5 mg daily.  Monitor blood  pressure at home.  Follow-up in 1 month for this. Chronic, improved.  Continue Protonix 40 mg daily.  She is improving her diet.  I think her symptoms will continue improved proved.  We will reevaluate after 60 total days in 1 month.  If she is still having symptoms we will refer to the gastroenterology team for further evaluation. The patient voiced understanding and agreement to the plan.  [place], DO 04/14/21  8:33 AM

## 2021-04-27 ENCOUNTER — Other Ambulatory Visit: Payer: Self-pay | Admitting: Family Medicine

## 2021-05-07 ENCOUNTER — Ambulatory Visit: Payer: 59 | Admitting: Family Medicine

## 2021-05-07 ENCOUNTER — Encounter: Payer: Self-pay | Admitting: Family Medicine

## 2021-05-07 VITALS — BP 128/84 | HR 96 | Temp 98.3°F | Ht 66.5 in | Wt 228.0 lb

## 2021-05-07 DIAGNOSIS — E1169 Type 2 diabetes mellitus with other specified complication: Secondary | ICD-10-CM

## 2021-05-07 DIAGNOSIS — E669 Obesity, unspecified: Secondary | ICD-10-CM | POA: Diagnosis not present

## 2021-05-07 DIAGNOSIS — I10 Essential (primary) hypertension: Secondary | ICD-10-CM | POA: Diagnosis not present

## 2021-05-07 LAB — COMPREHENSIVE METABOLIC PANEL
ALT: 17 U/L (ref 0–35)
AST: 18 U/L (ref 0–37)
Albumin: 4.1 g/dL (ref 3.5–5.2)
Alkaline Phosphatase: 132 U/L — ABNORMAL HIGH (ref 39–117)
BUN: 14 mg/dL (ref 6–23)
CO2: 30 mEq/L (ref 19–32)
Calcium: 9.5 mg/dL (ref 8.4–10.5)
Chloride: 100 mEq/L (ref 96–112)
Creatinine, Ser: 0.89 mg/dL (ref 0.40–1.20)
GFR: 69.65 mL/min (ref >60.00)
Glucose, Bld: 203 mg/dL — ABNORMAL HIGH (ref 70–99)
Potassium: 4.6 mEq/L (ref 3.5–5.1)
Sodium: 137 mEq/L (ref 135–145)
Total Bilirubin: 0.6 mg/dL (ref 0.2–1.2)
Total Protein: 6.3 g/dL (ref 6.0–8.3)

## 2021-05-07 NOTE — Patient Instructions (Addendum)
Keep the diet clean and stay active. ? ?Give [place] 2-3 business days to get the results of your labs back.  ? ?Very strong work with your weight loss.  ? ?Let us know if you need anything. ?

## 2021-05-07 NOTE — Progress Notes (Signed)
Subjective:  ? ?Chief Complaint  ?Patient presents with  ? Follow-up  ?  Blood pressure  ? ? ?Patricia Shelton is a 61 y.o. female here for follow-up of diabetes.   ?Patricia Shelton's self monitored glucose range is 130-150's in AM; non-fasting mid 100's.  ?Patient denies hypoglycemic reactions. ?She checks her glucose levels 2 time(s) per day. ?Patient does require insulin- Toujeo 15 u qhs.   ?Medications include: Trulicity 1.5 mg weekly, Metformin XR 500 mg/d (does not tolerate higher dosages) ?Diet is improving.  ?Exercise: walking ? ?Hypertension ?Patient presents for hypertension follow up. ?She does monitor home blood pressures. ?Blood pressures ranging on average from 120's/70-80's. ?She is compliant with medications- Norvasc 5 mg/d, HCTZ 25 mg/d, lisinopril 40 mg/d. ?Diet/exercise as above.  ?No Cp or SOB.  ? ?Past Medical History:  ?Diagnosis Date  ? Diabetes mellitus without complication (HCC)   ? High cholesterol   ? History of chicken pox   ? Hypertension   ? Obesity   ? Tobacco abuse   ?  ? ?Related testing: ?Retinal exam: Due ?Pneumovax: done ? ?Objective:  ?BP 128/84   Pulse 96   Temp 98.3 ?F (36.8 ?C) (Oral)   Ht 5' 6.5" (1.689 m)   Wt 228 lb (103.4 kg)   SpO2 98%   BMI 36.25 kg/m?  ?General:  Well developed, well nourished, in no apparent distress ?Lungs:  CTAB, no access msc use ?Cardio:  RRR, no bruits, no LE edema ?Psych: Age appropriate judgment and insight ? ?Assessment:  ? ?Diabetes mellitus type 2 in obese [facility]) - Plan: Comprehensive metabolic panel ? ?Essential hypertension  ? ?Plan:  ? ?Chronic, unstable but improving. Cont Trulicity 1.5 mg/week, Toujeo 15 u qhs, Metformin XR 500 mg/d.  Counseled on diet and exercise. ?Chronic, stable. Cont Norvasc 5 mg/d, HCTZ 25 mg/d, lisinopril 40 mg/d.  ?F/u in 3 mo. ?The patient voiced understanding and agreement to the plan. ? ?Sharlene Dory, DO ?05/07/21 ?7:33 AM ? ?

## 2021-05-26 ENCOUNTER — Other Ambulatory Visit: Payer: Self-pay | Admitting: Family Medicine

## 2021-05-26 DIAGNOSIS — I10 Essential (primary) hypertension: Secondary | ICD-10-CM

## 2021-05-27 ENCOUNTER — Other Ambulatory Visit: Payer: Self-pay | Admitting: Family Medicine

## 2021-05-27 DIAGNOSIS — E669 Obesity, unspecified: Secondary | ICD-10-CM

## 2021-06-01 ENCOUNTER — Other Ambulatory Visit: Payer: Self-pay | Admitting: Family Medicine

## 2021-06-28 ENCOUNTER — Encounter: Payer: Self-pay | Admitting: Family Medicine

## 2021-06-28 DIAGNOSIS — E669 Obesity, unspecified: Secondary | ICD-10-CM

## 2021-06-29 MED ORDER — TRULICITY 1.5 MG/0.5ML ~~LOC~~ SOAJ: SUBCUTANEOUS | 5 refills | Status: DC

## 2021-07-14 ENCOUNTER — Other Ambulatory Visit: Payer: Self-pay | Admitting: Family Medicine

## 2021-08-10 ENCOUNTER — Other Ambulatory Visit: Payer: Self-pay | Admitting: Family Medicine

## 2021-08-10 DIAGNOSIS — I10 Essential (primary) hypertension: Secondary | ICD-10-CM

## 2021-08-11 ENCOUNTER — Other Ambulatory Visit: Payer: Self-pay | Admitting: Family Medicine

## 2021-08-11 DIAGNOSIS — I10 Essential (primary) hypertension: Secondary | ICD-10-CM

## 2021-08-11 MED ORDER — AMLODIPINE BESYLATE 5 MG PO TABS: ORAL_TABLET | ORAL | 0 refills | Status: DC

## 2021-08-19 ENCOUNTER — Other Ambulatory Visit: Payer: Self-pay | Admitting: Family Medicine

## 2021-08-19 DIAGNOSIS — G8929 Other chronic pain: Secondary | ICD-10-CM

## 2021-08-19 MED ORDER — MELOXICAM 15 MG PO TABS: 15.0000 mg | ORAL_TABLET | Freq: Every day | ORAL | 0 refills | Status: DC

## 2021-08-19 NOTE — Telephone Encounter (Signed)
Requesting: tramadol 50mg   Contract: None UDS: None Last Visit: 05/07/21 Next Visit: None Last Refill:  03/17/21 #90 and 0RF  Please Advise

## 2021-08-20 MED ORDER — TRAMADOL HCL 50 MG PO TABS: 50.0000 mg | ORAL_TABLET | Freq: Two times a day (BID) | ORAL | 1 refills | Status: DC | PRN

## 2021-08-27 ENCOUNTER — Other Ambulatory Visit: Payer: Self-pay | Admitting: Family Medicine

## 2021-08-27 DIAGNOSIS — I10 Essential (primary) hypertension: Secondary | ICD-10-CM

## 2021-09-15 ENCOUNTER — Other Ambulatory Visit: Payer: Self-pay | Admitting: Family Medicine

## 2021-09-24 ENCOUNTER — Encounter: Payer: Self-pay | Admitting: Family Medicine

## 2021-09-24 ENCOUNTER — Ambulatory Visit: Payer: 59 | Admitting: Family Medicine

## 2021-09-24 VITALS — BP 130/82 | HR 84 | Temp 98.1°F | Ht 66.5 in | Wt 235.5 lb

## 2021-09-24 DIAGNOSIS — Z1231 Encounter for screening mammogram for malignant neoplasm of breast: Secondary | ICD-10-CM

## 2021-09-24 DIAGNOSIS — E1169 Type 2 diabetes mellitus with other specified complication: Secondary | ICD-10-CM

## 2021-09-24 DIAGNOSIS — E669 Obesity, unspecified: Secondary | ICD-10-CM

## 2021-09-24 MED ORDER — TRULICITY 3 MG/0.5ML ~~LOC~~ SOAJ: 3.0000 mg | SUBCUTANEOUS | 2 refills | Status: DC

## 2021-09-24 NOTE — Patient Instructions (Signed)
Get your eye exam.  Continue to monitor your sugars.   Send me a message with your sugar readings in 2 weeks.   Keep the diet clean and stay active.  Aim to do some physical exertion for 150 minutes per week. This is typically divided into 5 days per week, 30 minutes per day. The activity should be enough to get your heart rate up. Anything is better than nothing if you have time constraints.  Let us know if you need anything.

## 2021-09-24 NOTE — Progress Notes (Signed)
Subjective:   Chief Complaint  Patient presents with   Blood Sugar Problem    Patricia Shelton is a 61 y.o. female here for follow-up of diabetes.   Patricia Shelton's self monitored glucose range is mid 100's.  Patient denies hypoglycemic reactions. She checks her glucose levels several times per week.  Patient does require insulin.   Medications include: Trulicity 1.5 mg/week.  Diet is fair.  Exercise: none No Cp or SOB.   Past Medical History:  Diagnosis Date   Diabetes mellitus without complication (HCC)    High cholesterol    History of chicken pox    Hypertension    Obesity    Tobacco abuse      Related testing: Retinal exam: Due Pneumovax: done  Objective:  BP 130/82   Pulse 84   Temp 98.1 F (36.7 C) (Oral)   Ht 5' 6.5" (1.689 m)   Wt 235 lb 8 oz (106.8 kg)   SpO2 95%   BMI 37.44 kg/m  General:  Well developed, well nourished, in no apparent distress Skin:  Warm, no pallor or diaphoresis Lungs:  CTAB, no access msc use Cardio:  RRR, no bruits, no LE edema Psych: Age appropriate judgment and insight  Assessment:   Diabetes mellitus type 2 in obese (HCC) - Plan: Dulaglutide (TRULICITY) 3 MG/0.5ML SOPN, Hemoglobin A1c  Encounter for screening mammogram for malignant neoplasm of breast - Plan: MM DIGITAL SCREENING BILATERAL   Plan:   Had an episode of hypoglycemia and hyperglycemia since. Will increase Trulicity to 3 mg/week and then hopefully wean down on Toujeo 15 u qhs. Cont Metformin XR 500 mg/d. Monitor sugars at home. Ck A1c next week. Counseled on diet and exercise. F/u in 1 mo. The patient voiced understanding and agreement to the plan.  Jilda Roche [place], DO 09/24/21 1:50 PM

## 2021-10-06 ENCOUNTER — Other Ambulatory Visit (INDEPENDENT_AMBULATORY_CARE_PROVIDER_SITE_OTHER): Payer: 59

## 2021-10-06 DIAGNOSIS — E1169 Type 2 diabetes mellitus with other specified complication: Secondary | ICD-10-CM

## 2021-10-06 DIAGNOSIS — E669 Obesity, unspecified: Secondary | ICD-10-CM

## 2021-10-06 LAB — HEMOGLOBIN A1C: Hgb A1c MFr Bld: 8.3 % — ABNORMAL HIGH (ref 4.6–6.5)

## 2021-10-18 ENCOUNTER — Encounter (HOSPITAL_BASED_OUTPATIENT_CLINIC_OR_DEPARTMENT_OTHER): Payer: Self-pay

## 2021-10-18 ENCOUNTER — Ambulatory Visit (HOSPITAL_BASED_OUTPATIENT_CLINIC_OR_DEPARTMENT_OTHER)
Admission: RE | Admit: 2021-10-18 | Discharge: 2021-10-18 | Disposition: A | Discharge status: Home / Self Care | Payer: 59 | Source: Ambulatory Visit | Attending: Family Medicine | Admitting: Family Medicine

## 2021-10-18 DIAGNOSIS — Z1231 Encounter for screening mammogram for malignant neoplasm of breast: Secondary | ICD-10-CM | POA: Insufficient documentation

## 2021-10-20 ENCOUNTER — Other Ambulatory Visit: Payer: Self-pay | Admitting: Family Medicine

## 2021-10-29 ENCOUNTER — Ambulatory Visit: Payer: BC Managed Care – PPO | Admitting: Family Medicine

## 2021-11-03 ENCOUNTER — Ambulatory Visit: Payer: 59 | Admitting: Family Medicine

## 2021-11-03 ENCOUNTER — Encounter: Payer: Self-pay | Admitting: Family Medicine

## 2021-11-03 VITALS — BP 120/82 | HR 86 | Temp 97.8°F | Ht 66.5 in | Wt 233.4 lb

## 2021-11-03 DIAGNOSIS — G8929 Other chronic pain: Secondary | ICD-10-CM | POA: Diagnosis not present

## 2021-11-03 DIAGNOSIS — E669 Obesity, unspecified: Secondary | ICD-10-CM | POA: Diagnosis not present

## 2021-11-03 DIAGNOSIS — M25561 Pain in right knee: Secondary | ICD-10-CM

## 2021-11-03 DIAGNOSIS — E1169 Type 2 diabetes mellitus with other specified complication: Secondary | ICD-10-CM | POA: Diagnosis not present

## 2021-11-03 MED ORDER — BD PEN NEEDLE NANO 2ND GEN 32G X 4 MM MISC: 2 refills | Status: DC

## 2021-11-03 MED ORDER — METHYLPREDNISOLONE ACETATE 40 MG/ML IJ SUSP
40.0000 mg | Freq: Once | INTRAMUSCULAR | Status: AC
  Administered 2021-11-03: 40 mg via INTRA_ARTICULAR

## 2021-11-03 MED ORDER — METHYLPREDNISOLONE ACETATE 40 MG/ML IJ SUSP: 40.0000 mg | Freq: Once | INTRAMUSCULAR | Status: DC

## 2021-11-03 MED ORDER — TRAMADOL HCL 50 MG PO TABS: 50.0000 mg | ORAL_TABLET | Freq: Two times a day (BID) | ORAL | 1 refills | Status: DC | PRN

## 2021-11-03 NOTE — Patient Instructions (Addendum)
If you do not hear anything about your referrals in the next 1-2 weeks, call our office and ask for an update.  Keep the diet clean and stay active.  Ice/cold pack over area for 10-15 min twice daily.  Do your best with the stretches/exercises.   Let us know if you need anything.

## 2021-11-03 NOTE — Progress Notes (Signed)
Chief Complaint  Patient presents with   Knee Pain    Right     Subjective: Patient is a 61 y.o. female here for R knee pain.  Chronic R knee pain for many years.  Last injection was around 6 months ago and worked quite well for her.  Her dog ran into her 4 days ago and caused more pain and swelling.  She is having difficulty walking.  No redness or bruising, denies any fevers.  She takes tramadol and anti-inflammatories as needed.  Denies any catching or locking.  No neurologic signs or symptoms otherwise.  She is now ready to see a specialist to discuss what the neck steps are.  Past Medical History:  Diagnosis Date   Diabetes mellitus without complication (HCC)    High cholesterol    History of chicken pox    Hypertension    Obesity    Tobacco abuse     Objective: BP 120/82   Pulse 86   Temp 97.8 F (36.6 C) (Oral)   Ht 5' 6.5" (1.689 m)   Wt 233 lb 6 oz (105.9 kg)   SpO2 98%   BMI 37.10 kg/m  General: Awake, appears stated age Neuro: Antalgic gait, no cerebellar signs Lungs: No accessory muscle use MSK: Soft tissue edema noted grossly, mild effusion, there is no excessive warmth, erythema, or decreased range of motion Psych: Age appropriate judgment and insight, normal affect and mood  Procedure Note; Knee injection Verbal consent obtained. The area of the antero-lateral joint line was palpated and cleaned with alcohol x1. A 27-gauge needle was used to enter the joint space anterolaterally with ease. 40 mg of Depomedrol with 2 mL of 1% lidocaine was injected. A Band-Aid was placed. The patient tolerated the procedure well. There were no complications noted.  Assessment and Plan: Chronic pain of right knee - Plan: Ambulatory referral to Orthopedic Surgery, traMADol (ULTRAM) 50 MG tablet, methylPREDNISolone acetate (DEPO-MEDROL) injection 40 mg, PR DRAIN/INJECT LARGE JOINT/BURSA, DISCONTINUED: traMADol (ULTRAM) 50 MG tablet, DISCONTINUED: methylPREDNISolone acetate  (DEPO-MEDROL) injection 40 mg  Diabetes mellitus type 2 in obese [facility]) - Plan: Ambulatory referral to Ophthalmology  Refill Ultram, continue anti-inflammatories as needed.  Continue stretches and exercises for the knee.  Refer to Ortho.  Injection today.  Historically she has done well with these. Follow-up as originally scheduled. The patient voiced understanding and agreement to the plan.  Jilda Roche [place], DO 11/03/21  11:34 AM

## 2021-11-12 ENCOUNTER — Encounter: Payer: Self-pay | Admitting: Physician Assistant

## 2021-11-12 ENCOUNTER — Ambulatory Visit: Payer: 59 | Admitting: Physician Assistant

## 2021-11-12 ENCOUNTER — Telehealth: Payer: Self-pay

## 2021-11-12 VITALS — Ht 65.0 in | Wt 233.0 lb

## 2021-11-12 DIAGNOSIS — M25561 Pain in right knee: Secondary | ICD-10-CM

## 2021-11-12 DIAGNOSIS — G8929 Other chronic pain: Secondary | ICD-10-CM | POA: Diagnosis not present

## 2021-11-12 DIAGNOSIS — M1711 Unilateral primary osteoarthritis, right knee: Secondary | ICD-10-CM | POA: Diagnosis not present

## 2021-11-12 NOTE — Telephone Encounter (Signed)
Right knee gel injection  

## 2021-11-12 NOTE — Progress Notes (Signed)
Office Visit Note   Patient: Patricia Shelton           Date of Birth: Jul 04, 1960           MRN: [id number] Visit Date: 11/12/2021              Requested by: Patricia Dory, DO [address] [place],  [place] [address] PCP: Patricia Dory, DO  Chief Complaint  Patient presents with   Right Knee - Pain      HPI: Patricia Shelton is a very pleasant 61 year old woman with a chronic history of right knee pain.  She was last seen by me a little over a year ago.  She was compliant with doing physical therapy and keeping up the exercises.  She has also tried topical and oral anti-inflammatories.  She has also tried cortisone injections which have helped in the past.  She had 1 a week ago with her primary care provider and got little relief.  She is now starting to have night awakening because of the pain mostly on the medial side along with the patellofemoral joint.  She is limited in walking and activities she enjoys.  She would like to discuss what can next be done.  Assessment & Plan: Visit Diagnoses:  1. Chronic pain of right knee   2. Unilateral primary osteoarthritis, right knee     Plan: I had a long discussion with the patient today.  Her current BMI is 38.77.  Her most recent A1c is 8.3.  She has not yet tried viscosupplementation.  She has tried a brace once but it did not fit her properly.  I discussed with her we could try viscosupplementation as well as a brace.  Given her night awakening and daily symptoms she may very well be a good candidate for knee replacement.  She knows she needs to get her hemoglobin A1c under 7.5 and she has done this in the past.  We will see how she does with viscosupplementation she understands she cannot have a knee replacement within 6 months of viscosupplementation.  She will follow-up once this is approved.  This patient is diagnosed with osteoarthritis of the knee(s).    Radiographs show evidence of joint space narrowing,  osteophytes, subchondral sclerosis and/or subchondral cysts.  This patient has knee pain which interferes with functional and activities of daily living.    This patient has experienced inadequate response, adverse effects and/or intolerance with conservative treatments such as acetaminophen, NSAIDS, topical creams, physical therapy or regular exercise, knee bracing and/or weight loss.   This patient has experienced inadequate response or has a contraindication to intra articular steroid injections for at least 3 months.   This patient is not scheduled to have a total knee replacement within 6 months of starting treatment with viscosupplementation.   Follow-Up Instructions: No follow-ups on file.   Ortho Exam  Patient is alert, oriented, no adenopathy, well-dressed, normal affect, normal respiratory effort. Right knee she has no redness or effusion.  She does have crepitus with range of motion.  Tender over the medial side and patellofemoral joint.  Less tender over the lateral compartment.  X-rays taken last year were reviewed and she has loss of joint space on the medial compartment patellofemoral joint with periarticular osteophytes and sclerotic changes  Imaging: No results found. No images are attached to the encounter.  Labs: Lab Results  Component Value Date   HGBA1C 8.3 (H) 10/06/2021   HGBA1C 10.4 (H)  03/17/2021   HGBA1C 7.2 (H) 06/16/2020   LABORGA KLEBSIELLA PNEUMONIAE 08/12/2016     Lab Results  Component Value Date   ALBUMIN 4.1 05/07/2021   ALBUMIN 4.1 03/17/2021   ALBUMIN 3.9 06/16/2020    No results found for: "MG" No results found for: "VD25OH"  No results found for: "PREALBUMIN"    Latest Ref Rng & Units 03/17/2021    8:08 AM 06/16/2020    4:24 PM 10/07/2016    4:59 PM  CBC EXTENDED  WBC 4.0 - 10.5 K/uL 5.7  6.3  7.3   RBC 3.87 - 5.11 Mil/uL 4.66  4.50  4.61   Hemoglobin 12.0 - 15.0 g/dL 06.2  37.6  28.3   HCT 36.0 - 46.0 % 43.5  41.2  42.6   Platelets  150.0 - 400.0 K/uL 216.0  238.0  280      Body mass index is 38.77 kg/m.  Orders:  No orders of the defined types were placed in this encounter.  No orders of the defined types were placed in this encounter.    Procedures: No procedures performed  Clinical Data: No additional findings.  ROS:  All other systems negative, except as noted in the HPI. Review of Systems  Objective: Vital Signs: Ht 5\' 5"  (1.651 m)   Wt 233 lb (105.7 kg)   BMI 38.77 kg/m   Specialty Comments:  No specialty comments available.  PMFS History: Patient Active Problem List   Diagnosis Date Noted   Unilateral primary osteoarthritis, right knee 11/12/2021   Gastroesophageal reflux disease 03/17/2021   Chronic pain of right knee 12/13/2019   Tobacco abuse 01/18/2018   Chest pain 07/11/2016   Essential hypertension 07/11/2016   Diabetes mellitus type 2 in obese (HCC) 07/11/2016   Past Medical History:  Diagnosis Date   Diabetes mellitus without complication (HCC)    High cholesterol    History of chicken pox    Hypertension    Obesity    Tobacco abuse     Family History  Problem Relation Age of Onset   Other Mother        valvular heart disease from Rheumatic heart disease - s/p bioprosthetic mvr/avr   Cancer Mother        Breast   CAD Maternal Grandmother        CABG   Diabetes Maternal Grandfather     Past Surgical History:  Procedure Laterality Date   ABDOMINAL HYSTERECTOMY     APPENDECTOMY     CHOLECYSTECTOMY     Removed fatty tumor from back     Social History   Occupational History   Not on file  Tobacco Use   Smoking status: Every Day    Packs/day: 0.50    Years: 40.00    Total pack years: 20.00    Types: Cigarettes   Smokeless tobacco: Never   Tobacco comments:    smoked 1.5 ppd for ~ 40 yrs - recently cut down to 1/2 ppd.  Vaping Use   Vaping Use: Never used  Substance and Sexual Activity   Alcohol use: Yes    Comment: occ   Drug use: No   Sexual  activity: Not on file

## 2021-11-12 NOTE — Telephone Encounter (Signed)
VOB submitted for SynviscOne, right knee.  

## 2021-11-16 ENCOUNTER — Other Ambulatory Visit: Payer: Self-pay | Admitting: Family Medicine

## 2021-11-16 ENCOUNTER — Telehealth: Payer: Self-pay

## 2021-11-16 NOTE — Telephone Encounter (Signed)
PA submitted online through [employer] for SynviscOne, right knee. PA pending# [id number]

## 2021-11-19 ENCOUNTER — Telehealth: Payer: Self-pay

## 2021-11-19 DIAGNOSIS — M1711 Unilateral primary osteoarthritis, right knee: Secondary | ICD-10-CM

## 2021-11-19 NOTE — Telephone Encounter (Signed)
Tried calling patient to schedule for gel injection, but no answer and not able to leave a VM due to mailbox being filled.   Approved to have gel injection, please schedule patient with Dr. Durward Fortes or Bevely Palmer.  Check referrals tab

## 2021-11-23 ENCOUNTER — Other Ambulatory Visit: Payer: Self-pay | Admitting: Family Medicine

## 2021-11-23 DIAGNOSIS — I10 Essential (primary) hypertension: Secondary | ICD-10-CM

## 2021-12-13 ENCOUNTER — Other Ambulatory Visit: Payer: Self-pay | Admitting: Family Medicine

## 2021-12-13 DIAGNOSIS — I10 Essential (primary) hypertension: Secondary | ICD-10-CM

## 2021-12-13 MED ORDER — AMLODIPINE BESYLATE 5 MG PO TABS: ORAL_TABLET | ORAL | 0 refills | Status: DC

## 2021-12-23 ENCOUNTER — Other Ambulatory Visit: Payer: Self-pay | Admitting: Family Medicine

## 2021-12-23 DIAGNOSIS — E1169 Type 2 diabetes mellitus with other specified complication: Secondary | ICD-10-CM

## 2022-01-07 ENCOUNTER — Ambulatory Visit: Payer: BC Managed Care – PPO | Admitting: Family Medicine

## 2022-01-10 ENCOUNTER — Other Ambulatory Visit: Payer: Self-pay | Admitting: Family Medicine

## 2022-01-10 ENCOUNTER — Encounter: Payer: Self-pay | Admitting: Family Medicine

## 2022-01-10 ENCOUNTER — Ambulatory Visit (INDEPENDENT_AMBULATORY_CARE_PROVIDER_SITE_OTHER): Payer: 59 | Admitting: Family Medicine

## 2022-01-10 VITALS — BP 128/84 | HR 100 | Temp 98.1°F | Ht 66.5 in | Wt 233.1 lb

## 2022-01-10 DIAGNOSIS — I10 Essential (primary) hypertension: Secondary | ICD-10-CM

## 2022-01-10 DIAGNOSIS — E669 Obesity, unspecified: Secondary | ICD-10-CM | POA: Diagnosis not present

## 2022-01-10 DIAGNOSIS — E1169 Type 2 diabetes mellitus with other specified complication: Secondary | ICD-10-CM | POA: Diagnosis not present

## 2022-01-10 DIAGNOSIS — Z87891 Personal history of nicotine dependence: Secondary | ICD-10-CM | POA: Diagnosis not present

## 2022-01-10 LAB — COMPREHENSIVE METABOLIC PANEL
ALT: 15 U/L (ref 0–35)
AST: 17 U/L (ref 0–37)
Albumin: 4.1 g/dL (ref 3.5–5.2)
Alkaline Phosphatase: 110 U/L (ref 39–117)
BUN: 11 mg/dL (ref 6–23)
CO2: 29 mEq/L (ref 19–32)
Calcium: 9.5 mg/dL (ref 8.4–10.5)
Chloride: 99 mEq/L (ref 96–112)
Creatinine, Ser: 0.99 mg/dL (ref 0.40–1.20)
GFR: 61.01 mL/min (ref >60.00)
Glucose, Bld: 183 mg/dL — ABNORMAL HIGH (ref 70–99)
Potassium: 4.7 mEq/L (ref 3.5–5.1)
Sodium: 137 mEq/L (ref 135–145)
Total Bilirubin: 0.5 mg/dL (ref 0.2–1.2)
Total Protein: 6.3 g/dL (ref 6.0–8.3)

## 2022-01-10 LAB — LIPID PANEL
Cholesterol: 223 mg/dL — ABNORMAL HIGH (ref 0–200)
HDL: 56.9 mg/dL (ref >39.00)
LDL Cholesterol: 141 mg/dL — ABNORMAL HIGH (ref 0–99)
NonHDL: 166.17
Total CHOL/HDL Ratio: 4
Triglycerides: 128 mg/dL (ref 0.0–149.0)
VLDL: 25.6 mg/dL (ref 0.0–40.0)

## 2022-01-10 LAB — MICROALBUMIN / CREATININE URINE RATIO
Creatinine,U: 74.3 mg/dL
Microalb Creat Ratio: 0.9 mg/g (ref 0.0–30.0)
Microalb, Ur: <0.7 mg/dL (ref 0.0–1.9)

## 2022-01-10 LAB — HEMOGLOBIN A1C: Hgb A1c MFr Bld: 7.9 % — ABNORMAL HIGH (ref 4.6–6.5)

## 2022-01-10 MED ORDER — TOUJEO SOLOSTAR 300 UNIT/ML ~~LOC~~ SOPN: 18.0000 [IU] | PEN_INJECTOR | Freq: Every evening | SUBCUTANEOUS | 5 refills | Status: DC

## 2022-01-10 NOTE — Patient Instructions (Signed)
Give us 2-3 business days to get the results of your labs back.  ? ?Keep the diet clean and stay active. ? ?The Shingrix vaccine (for shingles) is a 2 shot series spaced 2-6 months apart. It can make people feel low energy, achy and almost like they have the flu for 48 hours after injection. 1/5 people can have nausea and/or vomiting. Please plan accordingly when deciding on when to get this shot. Call our office for a nurse visit appointment to get this. The second shot of the series is less severe regarding the side effects, but it still lasts 48 hours.  ? ?Let us know if you need anything. ?

## 2022-01-10 NOTE — Progress Notes (Signed)
Subjective:   Chief Complaint  Patient presents with   Follow-up    Patricia Shelton is a 61 y.o. female here for follow-up of diabetes.   Berdine's self monitored glucose range is mid 100's.  Patient denies hypoglycemic reactions. She checks her glucose levels several times per week Patient does require insulin. Toujeo 15 u qhs.   Medications include: metformin XR 500 mg/d, Trulicity 3 mg/week Diet is improving.  Exercise: limited 2/2 knee pain, some cycling  Hypertension Patient presents for hypertension follow up. She does monitor home blood pressures. Blood pressures ranging on average from 120's/80's. She is compliant with medications- Norvasc 5 mg/d, lisinopril 40 mg/d. Patient has these side effects of medication: none Diet/exercise. No Cp or SOB.   Past Medical History:  Diagnosis Date   Diabetes mellitus without complication (HCC)    High cholesterol    History of chicken pox    Hypertension    Obesity    Tobacco abuse      Related testing: Retinal exam: Due- has one scheduled Pneumovax: done  Objective:  BP 128/84 (BP Location: Left Arm, Cuff Size: Normal)   Pulse 100   Temp 98.1 F (36.7 C) (Oral)   Ht 5' 6.5" (1.689 m)   Wt 233 lb 2 oz (105.7 kg)   SpO2 95%   BMI 37.06 kg/m  General:  Well developed, well nourished, in no apparent distress Skin:  Warm, no pallor or diaphoresis Head:  Normocephalic, atraumatic Eyes:  Pupils equal and round, sclera anicteric without injection  Lungs:  CTAB, no access msc use Cardio:  RRR, no bruits, no LE edema Psych: Age appropriate judgment and insight  Assessment:   Diabetes mellitus type 2 in obese (HCC) - Plan: Comprehensive metabolic panel, Lipid panel, Hemoglobin A1c, Microalbumin / creatinine urine ratio  Essential hypertension  Stopped smoking with greater than 20 pack year history - Plan: Ambulatory Referral [facility]   Plan:   Chronic, unsure if stable. Cont Metformin XR 500  mg/d, Trulicity 3 mg/week, Toujeo 15 u qhs. Want it less than 7.7 for her surgery. I want it under 7.5, she has made some good improvements. Counseled on diet and exercise. F/u in 1-6 mo pending results. Chronic stable. Cont Norvasc 5 mg/d, lisinopril 40 mg/d.  Shingrix and flu shot politely declined.  The patient voiced understanding and agreement to the plan.  Patricia Roche [place], DO 01/10/22 7:54 AM

## 2022-01-13 ENCOUNTER — Other Ambulatory Visit: Payer: Self-pay | Admitting: Family Medicine

## 2022-01-13 DIAGNOSIS — E1169 Type 2 diabetes mellitus with other specified complication: Secondary | ICD-10-CM

## 2022-02-07 ENCOUNTER — Encounter: Payer: Self-pay | Admitting: Family Medicine

## 2022-02-07 DIAGNOSIS — E669 Obesity, unspecified: Secondary | ICD-10-CM

## 2022-02-08 MED ORDER — TRULICITY 3 MG/0.5ML ~~LOC~~ SOAJ: SUBCUTANEOUS | 2 refills | Status: DC

## 2022-02-11 ENCOUNTER — Ambulatory Visit (INDEPENDENT_AMBULATORY_CARE_PROVIDER_SITE_OTHER): Payer: 59 | Admitting: Family Medicine

## 2022-02-11 ENCOUNTER — Encounter: Payer: Self-pay | Admitting: Family Medicine

## 2022-02-11 VITALS — BP 122/72 | HR 94 | Temp 97.8°F | Ht 66.5 in | Wt 228.4 lb

## 2022-02-11 DIAGNOSIS — E669 Obesity, unspecified: Secondary | ICD-10-CM

## 2022-02-11 DIAGNOSIS — E1169 Type 2 diabetes mellitus with other specified complication: Secondary | ICD-10-CM

## 2022-02-11 LAB — HEMOGLOBIN A1C: Hgb A1c MFr Bld: 7.7 % — ABNORMAL HIGH (ref 4.6–6.5)

## 2022-02-11 NOTE — Patient Instructions (Signed)
Give us 2-3 business days to get the results of your labs back.   Keep the diet clean and stay active.  Let us know if you need anything. 

## 2022-02-11 NOTE — Progress Notes (Signed)
Subjective:   Chief Complaint  Patient presents with   Follow-up    Patricia Shelton is a 61 y.o. female here for follow-up of diabetes.   [name]'s self monitored glucose range is mid 100's.  Patient denies hypoglycemic reactions. She checks her glucose levels 2 time(s) per day. Patient does require insulin.  Toujeo 18 u qhs.  Medications include: Trulicity 3 mg/week, Metformin XR 500 mg/d.  Diet is improving.  Exercise: limited due to knee issues; walking No CP or SOB.   Past Medical History:  Diagnosis Date   Diabetes mellitus without complication (HCC)    High cholesterol    History of chicken pox    Hypertension    Obesity    Tobacco abuse      Related testing: Retinal exam: Due Pneumovax: done  Objective:  BP 122/72 (BP Location: Left Arm, Patient Position: Sitting, Cuff Size: Normal)   Pulse 94   Temp 97.8 F (36.6 C) (Oral)   Ht 5' 6.5" (1.689 m)   Wt 228 lb 6 oz (103.6 kg)   SpO2 95%   BMI 36.31 kg/m  General:  Well developed, well nourished, in no apparent distress Skin:  Warm, no pallor or diaphoresis Lungs:  CTAB, no access msc use Cardio:  RRR, no bruits, no LE edema Psych: Age appropriate judgment and insight  Assessment:   Diabetes mellitus type 2 in obese (HCC) - Plan: Hemoglobin A1c   Plan:   Chronic, uncontrolled. Will plan to increase Metformin from 500 mg/d to 1000 mg/d. May need to increase Toujeo as she needs A1c 7.7 or less to have knee surgery. Counseled on diet and exercise. F/u in 1-2 mo pending above. The patient voiced understanding and agreement to the plan.  Jilda Roche [place], DO 02/11/22 8:27 AM

## 2022-02-17 ENCOUNTER — Other Ambulatory Visit: Payer: Self-pay | Admitting: Family Medicine

## 2022-03-08 ENCOUNTER — Encounter: Payer: Self-pay | Admitting: Family Medicine

## 2022-03-08 DIAGNOSIS — E1169 Type 2 diabetes mellitus with other specified complication: Secondary | ICD-10-CM

## 2022-03-08 MED ORDER — METFORMIN HCL ER 500 MG PO TB24: 1000.0000 mg | ORAL_TABLET | Freq: Every day | ORAL | 0 refills | Status: DC

## 2022-03-11 ENCOUNTER — Encounter: Payer: Self-pay | Admitting: Family Medicine

## 2022-03-11 ENCOUNTER — Ambulatory Visit (INDEPENDENT_AMBULATORY_CARE_PROVIDER_SITE_OTHER): Payer: 59

## 2022-03-11 ENCOUNTER — Encounter: Payer: Self-pay | Admitting: Physician Assistant

## 2022-03-11 ENCOUNTER — Ambulatory Visit (INDEPENDENT_AMBULATORY_CARE_PROVIDER_SITE_OTHER): Payer: 59 | Admitting: Orthopedic Surgery

## 2022-03-11 DIAGNOSIS — R0989 Other specified symptoms and signs involving the circulatory and respiratory systems: Secondary | ICD-10-CM | POA: Diagnosis not present

## 2022-03-11 DIAGNOSIS — M1711 Unilateral primary osteoarthritis, right knee: Secondary | ICD-10-CM | POA: Diagnosis not present

## 2022-03-11 NOTE — Progress Notes (Signed)
Office Visit Note   Patient: Patricia Shelton           Date of Birth: Nov 18, 1960           MRN: [id number] Visit Date: 03/11/2022 Requested by: Shelda Pal, [address] [place],  [place] [address] PCP: Shelda Pal, DO  Subjective: Chief Complaint  Patient presents with   Right Knee - Pain    HPI: Patricia Shelton is a 62 y.o. female who presents to the office reporting chronic right knee pain.  She injured her knee at age 36.  Pain has been severe for the past 4 years.  A1c 7.7 at the end of December.  She describes pain which wakes her from sleep at night.  Describes swelling stiffness but no locking or popping.  Patient is diabetic.  Had 1 episode of chest pain in 2018.  The pain is interfering with almost all of her activities of daily living.  She describes about 5 to 10 minutes of walking endurance.  Left knee has no problems.  No personal or family history of DVT or pulmonary embolism.  Husband lives at home.  4 stairs at home..                ROS: All systems reviewed are negative as they relate to the chief complaint within the history of present illness.  Patient denies fevers or chills.  Assessment & Plan: Visit Diagnoses:  1. Unilateral primary osteoarthritis, right knee   2. Diminished pulses in lower extremity     Plan: Impression is end-stage arthritis in the right knee.  Radiographs show severe tricompartmental arthritis worse in the medial compartment.  Plan is right total knee replacement.  The risk and benefits are discussed with the patient include not limited to infection or vessel damage knee stiffness incomplete pain resolution as well as the grueling nature of the rehabilitative process.  Patient understands risk benefits and wishes to proceed.  We will obtain medical risk ratification from her primary care provider.  Also plan to get ABIs because her pedal pulses are asymmetric for right versus left.  Pedal pulses easily palpable on  the left-hand side 2+ out of 4.  On the right not really palpable or dopplerable.  Posterior tib pulses dopplerable on the right-hand side.  Foot is perfused but I would like to get some clarity on the perfusion status of that right lower extremity prior to using a tourniquet.  All questions answered.  Follow-Up Instructions: No follow-ups on file.   Orders:  Orders Placed This Encounter  Procedures   XR KNEE 3 VIEW RIGHT   VAS [place] ABI WITH/WO TBI   No orders of the defined types were placed in this encounter.     Procedures: No procedures performed   Clinical Data: No additional findings.  Objective: Vital Signs: There were no vitals taken for this visit.  Physical Exam:  Constitutional: Patient appears well-developed HEENT:  Head: Normocephalic Eyes:EOM are normal Neck: Normal range of motion Cardiovascular: Normal rate Pulmonary/chest: Effort normal Neurologic: Patient is alert Skin: Skin is warm Psychiatric: Patient has normal mood and affect  Ortho Exam: Patient has antalgic gait to the right.  No effusion in the right knee.  Range of motion 0-1 20 with medial greater than lateral joint line tenderness.  Extensor mechanism intact.  Collateral and cruciate ligaments feel stable.  No groin pain with internal/external Tat[place] of the leg.  No other masses lymphadenopathy or  skin changes noted in that right knee region.  Radiographic review demonstrates severe end-stage arthritis bone-on-bone changes in the medial compartment as well as patellofemoral compartment.  Specialty Comments:  No specialty comments available.  Imaging: XR KNEE 3 VIEW RIGHT  Result Date: 03/11/2022 AP lateral merchant radiographs right knee reviewed.  Severe tricompartmental end-stage arthritis is present worse in the medial compartment.  Bone-on-bone changes present.  Mild varus alignment present.  No acute fracture    PMFS History: Patient Active Problem List   Diagnosis Date Noted    Unilateral primary osteoarthritis, right knee 11/12/2021   Gastroesophageal reflux disease 03/17/2021   Chronic pain of right knee 12/13/2019   Tobacco abuse 01/18/2018   Chest pain 07/11/2016   Essential hypertension 07/11/2016   Diabetes mellitus type 2 in obese ([place]) 07/11/2016   Past Medical History:  Diagnosis Date   Diabetes mellitus without complication (HCC)    High cholesterol    History of chicken pox    Hypertension    Obesity    Tobacco abuse     Family History  Problem Relation Age of Onset   Other Mother        valvular heart disease from Rheumatic heart disease - s/p bioprosthetic mvr/avr   Cancer Mother        Breast   CAD Maternal Grandmother        CABG   Diabetes Maternal Grandfather     Past Surgical History:  Procedure Laterality Date   ABDOMINAL HYSTERECTOMY     APPENDECTOMY     CHOLECYSTECTOMY     Removed fatty tumor from back     Social History   Occupational History   Not on file  Tobacco Use   Smoking status: Former    Packs/day: 1.00    Years: 40.00    Total pack years: 40.00    Types: Cigarettes    Quit date: 06/2021    Years since quitting: 0.7   Smokeless tobacco: Never   Tobacco comments:    smoked 1.5 ppd for ~ 40 yrs - recently cut down to 1/2 ppd.  Vaping Use   Vaping Use: Never used  Substance and Sexual Activity   Alcohol use: Yes    Comment: occ   Drug use: No   Sexual activity: Yes    Partners: Male

## 2022-03-14 ENCOUNTER — Other Ambulatory Visit (HOSPITAL_BASED_OUTPATIENT_CLINIC_OR_DEPARTMENT_OTHER): Payer: Self-pay

## 2022-03-14 ENCOUNTER — Encounter: Payer: Self-pay | Admitting: Family Medicine

## 2022-03-14 DIAGNOSIS — I10 Essential (primary) hypertension: Secondary | ICD-10-CM

## 2022-03-14 MED ORDER — PRAVASTATIN SODIUM 40 MG PO TABS
40.0000 mg | ORAL_TABLET | Freq: Every day | ORAL | 3 refills | Status: DC
  Filled 2022-03-14 – 2022-05-02 (×2): qty 90, 90d supply, fill #0
  Filled 2022-08-01: qty 90, 90d supply, fill #1
  Filled 2022-11-17: qty 90, 90d supply, fill #2
  Filled 2023-02-13: qty 90, 90d supply, fill #3

## 2022-03-14 MED ORDER — AMLODIPINE BESYLATE 5 MG PO TABS
5.0000 mg | ORAL_TABLET | Freq: Every day | ORAL | 0 refills | Status: DC
  Filled 2022-03-14: qty 90, 90d supply, fill #0

## 2022-03-16 ENCOUNTER — Other Ambulatory Visit (HOSPITAL_BASED_OUTPATIENT_CLINIC_OR_DEPARTMENT_OTHER): Payer: Self-pay

## 2022-03-16 ENCOUNTER — Ambulatory Visit (INDEPENDENT_AMBULATORY_CARE_PROVIDER_SITE_OTHER): Payer: 59 | Admitting: Family Medicine

## 2022-03-16 ENCOUNTER — Encounter: Payer: Self-pay | Admitting: Family Medicine

## 2022-03-16 VITALS — BP 136/88 | HR 84 | Temp 97.8°F | Ht 66.5 in | Wt 228.5 lb

## 2022-03-16 DIAGNOSIS — K219 Gastro-esophageal reflux disease without esophagitis: Secondary | ICD-10-CM | POA: Diagnosis not present

## 2022-03-16 DIAGNOSIS — Z01818 Encounter for other preprocedural examination: Secondary | ICD-10-CM

## 2022-03-16 MED ORDER — PANTOPRAZOLE SODIUM 40 MG PO TBEC
40.0000 mg | DELAYED_RELEASE_TABLET | Freq: Every day | ORAL | 3 refills | Status: DC
  Filled 2022-03-16: qty 90, 90d supply, fill #0
  Filled 2022-06-13: qty 90, 90d supply, fill #1
  Filled 2022-09-06: qty 90, 90d supply, fill #2
  Filled 2022-12-20: qty 90, 90d supply, fill #3

## 2022-03-16 NOTE — Progress Notes (Signed)
Subjective:   Chief Complaint  Patient presents with   Pre-op Exam    Patricia Shelton  is here for a Pre-operative physical at the request of Dr. Marlou Sa.   She  is having a total R knee replacement surgery that is not yet scheduled for severe R knee OA.  Personal or family hx of adverse outcome to anesthesia? No  Chipped, cracked, missing, or loose teeth? Chipped tooth on R lower region and L upper from childhood; nothing in front Decreased ROM of neck? No  Able to walk up 2 flights of stairs without becoming significantly short of breath or having chest pain? Yes  Revised Goldman Criteria: High Risk Surgery (intraperitoneal, intrathoracic, aortic): No  Ischemic heart disease (Prior MI, +excercise stress test, angina, nitrate use, Qwave): No  History of heart failure: No  History of cerebrovascular disease: No  History of diabetes: Yes  Insulin therapy for DM: No  Preoperative Cr >2.0: No   Patient Active Problem List   Diagnosis Date Noted   Unilateral primary osteoarthritis, right knee 11/12/2021   Gastroesophageal reflux disease 03/17/2021   Chronic pain of right knee 12/13/2019   Tobacco abuse 01/18/2018   Chest pain 07/11/2016   Essential hypertension 07/11/2016   Diabetes mellitus type 2 in obese ([place]) 07/11/2016   Past Medical History:  Diagnosis Date   Diabetes mellitus without complication (HCC)    High cholesterol    History of chicken pox    Hypertension    Obesity    Tobacco abuse     Past Surgical History:  Procedure Laterality Date   ABDOMINAL HYSTERECTOMY     APPENDECTOMY     CHOLECYSTECTOMY     Removed fatty tumor from back      Current Outpatient Medications  Medication Sig Dispense Refill   albuterol (VENTOLIN HFA) 108 (90 Base) MCG/ACT inhaler INHALE 2 PUFFS INTO THE LUNGS EVERY 6 HOURS AS NEEDED FOR WHEEZING OR SHORTNESS OF BREATH 18 g 0   amLODipine (NORVASC) 5 MG tablet Take 1 tablet (5 mg total) by mouth daily. 90 tablet 0   aspirin 81 MG chewable  tablet Chew 81 mg by mouth daily.      BD PEN NEEDLE NANO 2ND GEN 32G X 4 MM MISC USE AS DIRECTED 100 each 2   Blood Glucose Calibration (ACCU-CHEK AVIVA) SOLN Use for glucometer calibration 1 each 3   Blood Glucose Monitoring Suppl (ACCU-CHEK GUIDE ME) w/Device KIT Use to check blood sugar twice a day. DX 1 kit 0   buPROPion (ZYBAN) 150 MG 12 hr tablet 1 week before quit date, take 1 tab daily for 3 days and then 1 tab twice daily. 60 tablet 2   Dulaglutide (TRULICITY) 3 YF/7.4BS SOPN ADMINISTER 3 MG UNDER THE SKIN 1 TIME A WEEK AS DIRECTED 2 mL 2   glucose blood (ACCU-CHEK GUIDE) test strip Use as instructed to check blood sugar twice a day.   DX  E11.9 100 each 2   hydrochlorothiazide (HYDRODIURIL) 25 MG tablet TAKE 1 TABLET(25 MG) BY MOUTH DAILY 90 tablet 2   insulin glargine, 1 Unit Dial, (TOUJEO SOLOSTAR) 300 UNIT/ML Solostar Pen Inject 18 Units into the skin at bedtime. 4.5 mL 5   Lancets Misc. (ACCU-CHEK SOFTCLIX LANCET DEV) KIT Check blood sugars twice daily 1 kit 12   levocetirizine (XYZAL) 5 MG tablet Take 1 tablet (5 mg total) by mouth every evening. 30 tablet 2   lisinopril (ZESTRIL) 40 MG tablet TAKE 1 TABLET(40 MG) BY MOUTH DAILY  90 tablet 2   meloxicam (MOBIC) 15 MG tablet TAKE 1 TABLET(15 MG) BY MOUTH DAILY 30 tablet 2   metFORMIN (GLUCOPHAGE-XR) 500 MG 24 hr tablet Take 2 tablets (1,000 mg total) by mouth daily with breakfast. 180 tablet 0   pantoprazole (PROTONIX) 40 MG tablet Take 1 tablet (40 mg total) by mouth daily. 90 tablet 3   pravastatin (PRAVACHOL) 40 MG tablet Take 1 tablet (40 mg total) by mouth daily. 90 tablet 3   traMADol (ULTRAM) 50 MG tablet Take 1 tablet (50 mg total) by mouth every 12 (twelve) hours as needed (Pain). 90 tablet 1   Allergies  Allergen Reactions   Sulfa Antibiotics Nausea And Vomiting    Family History  Problem Relation Age of Onset   Other Mother        valvular heart disease from Rheumatic heart disease - s/p bioprosthetic mvr/avr    Cancer Mother        Breast   CAD Maternal Grandmother        CABG   Diabetes Maternal Grandfather      Review of Systems:  Constitutional:  no fevers Eye:  no recent significant change in vision Ear:  no hearing loss Nose/Mouth/Throat:  No dental complaints Neck/Thyroid:  no lumps or masses Pulmonary:  No shortness of breath Cardiovascular:  no chest pain Gastrointestinal:  no abdominal pain GU:  negative for dysuria Musculoskeletal/Extremities: +knee pain Skin/Integumentary ROS:  no abnormal skin lesions reported Neurologic:  no HA  Objective:   Vitals:   03/16/22 0726  BP: (!) 142/90  Pulse: (!) 105  Temp: 97.8 F (36.6 C)  TempSrc: Oral  SpO2: 99%  Weight: 228 lb 8 oz (103.6 kg)  Height: 5' 6.5" (1.689 m)   Body mass index is 36.33 kg/m.  General:  well developed, well nourished, in no apparent distress Skin:  warm, no pallor or diaphoresis Head:  normocephalic, atraumatic Eyes:  pupils equal and round, sclera anicteric without injection Ears:  canals without lesions, TMs shiny without retraction, no obvious effusion, no erythema Throat/Pharynx:  lips and gingiva without lesion; tongue and uvula midline; non-inflamed pharynx; no exudates or postnasal drainage Neck: neck supple without adenopathy, thyromegaly, or masses, no bruits, no jugular venous distention Lungs:  clear to auscultation, breath sounds equal bilaterally, no respiratory distress Cardio:  regular rate and rhythm without murmurs Abdomen:  abdomen soft, nontender; bowel sounds normal; no masses, hepatomegaly or splenomegaly Musculoskeletal:  symmetrical muscle groups noted without atrophy or deformity Extremities:  no clubbing, cyanosis, or edema, no deformities, no skin discoloration Neuro:  gait normal; deep tendon reflexes normal and symmetric and alert and oriented to person, place, and time Psych: Age appropriate judgment and insight; normal mood   Assessment:   Pre-op exam   Plan:    Orders as above. A1c is 7.7.  She will stop ASA 5 d prior to planned procedure. Asked her to reach out to ortho team to have them fax over pre-op clearance paperwork. She is otherwise medically optimized and deemed low cardiac risk for the proposed procedure.  The patient voiced understanding and agreement to the plan.  [place], DO 03/16/22  7:30 AM

## 2022-03-16 NOTE — Patient Instructions (Addendum)
Stop the aspirin 5 days prior to your planned procedure.  You do not need to stop any of your other medicines.   Let us know if you need anything.

## 2022-03-23 ENCOUNTER — Other Ambulatory Visit (HOSPITAL_BASED_OUTPATIENT_CLINIC_OR_DEPARTMENT_OTHER): Payer: Self-pay

## 2022-03-23 ENCOUNTER — Encounter: Payer: Self-pay | Admitting: Family Medicine

## 2022-03-23 MED ORDER — MELOXICAM 15 MG PO TABS
15.0000 mg | ORAL_TABLET | Freq: Every day | ORAL | 2 refills | Status: DC
  Filled 2022-03-23 – 2022-03-28 (×3): qty 30, 30d supply, fill #0
  Filled 2022-04-27: qty 30, 30d supply, fill #1
  Filled 2022-06-01 (×2): qty 30, 30d supply, fill #2

## 2022-03-28 ENCOUNTER — Other Ambulatory Visit (HOSPITAL_BASED_OUTPATIENT_CLINIC_OR_DEPARTMENT_OTHER): Payer: Self-pay

## 2022-03-31 ENCOUNTER — Other Ambulatory Visit (HOSPITAL_BASED_OUTPATIENT_CLINIC_OR_DEPARTMENT_OTHER): Payer: Self-pay

## 2022-04-04 ENCOUNTER — Other Ambulatory Visit: Payer: Self-pay | Admitting: Family Medicine

## 2022-04-04 ENCOUNTER — Encounter: Payer: Self-pay | Admitting: Family Medicine

## 2022-04-04 ENCOUNTER — Other Ambulatory Visit (HOSPITAL_BASED_OUTPATIENT_CLINIC_OR_DEPARTMENT_OTHER): Payer: Self-pay

## 2022-04-04 DIAGNOSIS — E1169 Type 2 diabetes mellitus with other specified complication: Secondary | ICD-10-CM

## 2022-04-04 MED ORDER — TRULICITY 3 MG/0.5ML ~~LOC~~ SOAJ
3.0000 mg | SUBCUTANEOUS | 2 refills | Status: DC
  Filled 2022-04-04: qty 2, 28d supply, fill #0
  Filled 2022-05-02: qty 2, 28d supply, fill #1
  Filled 2022-06-06: qty 2, 28d supply, fill #2

## 2022-04-07 ENCOUNTER — Telehealth: Payer: Self-pay

## 2022-04-07 NOTE — Telephone Encounter (Signed)
Patient still has not heard anything about ABI studies. Can you please check into this?

## 2022-04-07 NOTE — Telephone Encounter (Signed)
-----   Message from Javier Glazier sent at 04/07/2022  3:51 PM EST ----- Regarding: RIGHT TKA Can PCP clear this patient from a cardiac standpoint or is Dr. Marlou Sa wanting patient seen by Cardiologist?  2018 was the last know visit with Cardiology.  Please refer to Dr. Serita Sheller notes of 03-16-22.  He has cleared patient and stated ok to hold ASA for 5 days.  Please advise so I can have PCP refer patient to cardiology or schedule patient for knee replacement now.

## 2022-04-14 ENCOUNTER — Ambulatory Visit (HOSPITAL_COMMUNITY)
Admission: RE | Admit: 2022-04-14 | Discharge: 2022-04-14 | Disposition: A | Discharge status: Home / Self Care | Payer: 59 | Source: Ambulatory Visit | Attending: Surgery | Admitting: Surgery

## 2022-04-14 DIAGNOSIS — R0989 Other specified symptoms and signs involving the circulatory and respiratory systems: Secondary | ICD-10-CM | POA: Diagnosis present

## 2022-04-14 LAB — VAS US ABI WITH/WO TBI
Left ABI: 1.12
Right ABI: 1.15

## 2022-04-15 ENCOUNTER — Other Ambulatory Visit (HOSPITAL_BASED_OUTPATIENT_CLINIC_OR_DEPARTMENT_OTHER): Payer: Self-pay

## 2022-04-15 ENCOUNTER — Ambulatory Visit: Payer: 59 | Admitting: Family Medicine

## 2022-04-15 ENCOUNTER — Encounter: Payer: Self-pay | Admitting: Family Medicine

## 2022-04-15 VITALS — BP 138/80 | HR 88 | Temp 97.8°F | Ht 66.5 in | Wt 226.0 lb

## 2022-04-15 DIAGNOSIS — E669 Obesity, unspecified: Secondary | ICD-10-CM | POA: Diagnosis not present

## 2022-04-15 DIAGNOSIS — E1169 Type 2 diabetes mellitus with other specified complication: Secondary | ICD-10-CM | POA: Diagnosis not present

## 2022-04-15 MED ORDER — METFORMIN HCL ER 500 MG PO TB24
1000.0000 mg | ORAL_TABLET | Freq: Two times a day (BID) | ORAL | 1 refills | Status: AC
  Filled 2022-04-15: qty 360, 90d supply, fill #0
  Filled 2022-08-28: qty 360, 90d supply, fill #1

## 2022-04-15 NOTE — Progress Notes (Signed)
Subjective:   Chief Complaint  Patient presents with   Follow-up    Patricia Shelton is a 62 y.o. female here for follow-up of diabetes.   [name]'s self monitored glucose range is mid-high 's.  Patient denies hypoglycemic reactions. She checks her glucose levels 1 time(s) per day. Patient does not require insulin.   Medications include: Trulicity 3 mg/week, Metformin XR 1000 mg bid Diet is OK.  Exercise: walking No CP or SOB.   Past Medical History:  Diagnosis Date   Diabetes mellitus without complication (HCC)    High cholesterol    History of chicken pox    Hypertension    Obesity    Tobacco abuse      Related testing: Retinal exam: Due Pneumovax: done  Objective:  BP 138/80 (BP Location: Left Arm, Patient Position: Sitting, Cuff Size: Normal)   Pulse 88   Temp 97.8 F (36.6 C) (Oral)   Ht 5' 6.5" (1.689 m)   Wt 226 lb (102.5 kg)   SpO2 95%   BMI 35.93 kg/m  General:  Well developed, well nourished, in no apparent distress Skin:  Warm, no pallor or diaphoresis Lungs:  CTAB, no access msc use Cardio:  RRR, no bruits, no LE edema Musculoskeletal:  Symmetrical muscle groups noted without atrophy or deformity Neuro:  Sensation intact to pinprick on feet Psych: Age appropriate judgment and insight  Assessment:   Diabetes mellitus type 2 in obese ([place]) - Plan: metFORMIN (GLUCOPHAGE-XR) 500 MG 24 hr tablet   Plan:   Chronic, uncontrolled. Increase Metformin XR 1000 mg/d to 1000 mg in AM and 500 mg in PM for a week and then 1000 mg/d bid. She will let me know if there are issues. Monitor sugars at home. Counseled on diet and exercise. Needs to sched eye exam.  F/u in 3 mo. The patient voiced understanding and agreement to the plan.  [place], DO 04/15/22 7:41 AM

## 2022-04-15 NOTE — Patient Instructions (Addendum)
Keep the diet clean and stay active.  Let me know if the are issues taking the Metformin twice daily. Start with 2 tabs in the morning and 1 tab in the evening for 1 week. If we do well, then take 2 tabs twice daily.   Let us know if you need anything.

## 2022-04-15 NOTE — Progress Notes (Signed)
Hi Patricia Shelton her Doppler is fine so she is good to go for whenever she wants.  Thanks for total knee replacement.  Making sure you have sheet.

## 2022-04-20 ENCOUNTER — Telehealth: Payer: Self-pay | Admitting: Orthopedic Surgery

## 2022-04-20 NOTE — Telephone Encounter (Signed)
[employer] forms received. To [employer].

## 2022-05-02 ENCOUNTER — Other Ambulatory Visit (HOSPITAL_BASED_OUTPATIENT_CLINIC_OR_DEPARTMENT_OTHER): Payer: Self-pay

## 2022-05-16 ENCOUNTER — Telehealth: Payer: Self-pay | Admitting: Orthopedic Surgery

## 2022-05-16 NOTE — Telephone Encounter (Signed)
FYI:   Patient is scheduled for RIGHT TOTAL KNEE ATHROPLASTY.  Was diagnosed with UTI on Sunday morning ( 05-15-22).  She has been given antibiotics and urinary pain relief at [facility] in [place].   She states she is feeling better today and he burning is gone, but still has some aches and pains in the lower back. Preadmission appointment is 3pm on Thursday March 21st.

## 2022-05-18 NOTE — Pre-Procedure Instructions (Signed)
Surgical Instructions    Your procedure is scheduled on May 26, 2022.  Report to [facility] Main Entrance "A" at 5:30 A.M., then check in with the Admitting office.  Call this number if you have problems the morning of surgery:  [phone]  If you have any questions prior to your surgery date call [phone]: Open Monday-Friday 8am-4pm If you experience any cold or flu symptoms such as cough, fever, chills, shortness of breath, etc. between now and your scheduled surgery, please notify us at the above number.     Remember:  Do not eat after midnight the night before your surgery  You may drink clear liquids until 4:30 AM the morning of your surgery.   Clear liquids allowed are: Water, Non-Citrus Juices (without pulp), Carbonated Beverages, Clear Tea, Black Coffee Only (NO MILK, CREAM OR POWDERED CREAMER of any kind), and Gatorade.   Patient Instructions  The night before surgery:  No food after midnight. ONLY clear liquids after midnight  The day of surgery (if you have diabetes): Drink ONE (1) 12 oz G2 given to you in your pre admission testing appointment by 4:30 AM the morning of surgery. Drink in one sitting. Do not sip.  This drink was given to you during your hospital  pre-op appointment visit.  Nothing else to drink after completing the  12 oz bottle of G2.         If you have questions, please contact your surgeon's office.     Take these medicines the morning of surgery with A SIP OF WATER:  amLODipine (NORVASC)   nitrofurantoin, macrocrystal-monohydrate, (MACROBID)   pantoprazole (PROTONIX)   pravastatin (PRAVACHOL)      May take these medicines IF NEEDED:  albuterol (VENTOLIN HFA) inhaler   traMADol (ULTRAM)    Follow your surgeon's instructions on when to stop Aspirin.  If no instructions were given by your surgeon then you will need to call the office to get those instructions.     As of today, STOP taking any Aleve, Naproxen, Ibuprofen, Motrin,  Advil, Goody's, BC's, all herbal medications, fish oil, and all vitamins. This includes your medication: meloxicam (MOBIC).   WHAT DO I DO ABOUT MY DIABETES MEDICATION?   Do not take metFORMIN (GLUCOPHAGE-XR) the morning of surgery.  THE NIGHT BEFORE SURGERY, take 9 units of insulin glargine (TOUJEO SOLOSTAR)      STOP taking your Dulaglutide (TRULICITY) one week prior to surgery.    HOW TO MANAGE YOUR DIABETES BEFORE AND AFTER SURGERY  Why is it important to control my blood sugar before and after surgery? Improving blood sugar levels before and after surgery helps healing and can limit problems. A way of improving blood sugar control is eating a healthy diet by:  Eating less sugar and carbohydrates  Increasing activity/exercise  Talking with your doctor about reaching your blood sugar goals High blood sugars (greater than 180 mg/dL) can raise your risk of infections and slow your recovery, so you will need to focus on controlling your diabetes during the weeks before surgery. Make sure that the doctor who takes care of your diabetes knows about your planned surgery including the date and location.  How do I manage my blood sugar before surgery? Check your blood sugar at least 4 times a day, starting 2 days before surgery, to make sure that the level is not too high or low.  Check your blood sugar the morning of your surgery when you wake up and every 2 hours until  you get to the Short Stay unit.  If your blood sugar is less than 70 mg/dL, you will need to treat for low blood sugar: Do not take insulin. Treat a low blood sugar (less than 70 mg/dL) with  cup of clear juice (cranberry or apple), 4 glucose tablets, OR glucose gel. Recheck blood sugar in 15 minutes after treatment (to make sure it is greater than 70 mg/dL). If your blood sugar is not greater than 70 mg/dL on recheck, call [phone] for further instructions. Report your blood sugar to the short stay nurse when you  get to Short Stay.  If you are admitted to the hospital after surgery: Your blood sugar will be checked by the staff and you will probably be given insulin after surgery (instead of oral diabetes medicines) to make sure you have good blood sugar levels. The goal for blood sugar control after surgery is 80-180 mg/dL.                      Do NOT Smoke (Tobacco/Vaping) for 24 hours prior to your procedure.  If you use a CPAP at night, you may bring your mask/headgear for your overnight stay.   Contacts, glasses, piercing's, hearing aid's, dentures or partials may not be worn into surgery, please bring cases for these belongings.    For patients admitted to the hospital, discharge time will be determined by your treatment team.   Patients discharged the day of surgery will not be allowed to drive home, and someone needs to stay with them for 24 hours.  SURGICAL WAITING ROOM VISITATION Patients having surgery or a procedure may have no more than 2 support people in the waiting area - these visitors may rotate.   Children under the age of 74 must have an adult with them who is not the patient. If the patient needs to stay at the hospital during part of their recovery, the visitor guidelines for inpatient rooms apply. Pre-op nurse will coordinate an appropriate time for 1 support person to accompany patient in pre-op.  This support person may not rotate.   Please refer to the [facility] website for the visitor guidelines for Inpatients (after your surgery is over and you are in a regular room).    Special instructions:   [place]- Preparing For Surgery  Before surgery, you can play an important role. Because skin is not sterile, your skin needs to be as free of germs as possible. You can reduce the number of germs on your skin by washing with CHG (chlorahexidine gluconate) Soap before surgery.  CHG is an antiseptic cleaner which kills germs and bonds with the skin to continue killing germs  even after washing.    Oral Hygiene is also important to reduce your risk of infection.  Remember - BRUSH YOUR TEETH THE MORNING OF SURGERY WITH YOUR REGULAR TOOTHPASTE  Please do not use if you have an allergy to CHG or antibacterial soaps. If your skin becomes reddened/irritated stop using the CHG.  Do not shave (including legs and underarms) for at least 48 hours prior to first CHG shower. It is OK to shave your face.  Please follow these instructions carefully.   Shower the NIGHT BEFORE SURGERY and the MORNING OF SURGERY  If you chose to wash your hair, wash your hair first as usual with your normal shampoo.  After you shampoo, rinse your hair and body thoroughly to remove the shampoo.  Use CHG Soap as you would  any other liquid soap. You can apply CHG directly to the skin and wash gently with a scrungie or a clean washcloth.   Apply the CHG Soap to your body ONLY FROM THE NECK DOWN.  Do not use on open wounds or open sores. Avoid contact with your eyes, ears, mouth and genitals (private parts). Wash Face and genitals (private parts)  with your normal soap.   Wash thoroughly, paying special attention to the area where your surgery will be performed.  Thoroughly rinse your body with warm water from the neck down.  DO NOT shower/wash with your normal soap after using and rinsing off the CHG Soap.  Pat yourself dry with a CLEAN TOWEL.  Wear CLEAN PAJAMAS to bed the night before surgery  Place CLEAN SHEETS on your bed the night before your surgery  DO NOT SLEEP WITH PETS.   Day of Surgery: Take a shower with CHG soap. Do not wear jewelry or makeup Do not wear lotions, powders, perfumes/colognes, or deodorant. Do not shave 48 hours prior to surgery.  Men may shave face and neck. Do not bring valuables to the hospital.  [facility] is not responsible for any belongings or valuables. Do not wear nail polish, gel polish, artificial nails, or any other type of covering on natural  nails (fingers and toes) If you have artificial nails or gel coating that need to be removed by a nail salon, please have this removed prior to surgery. Artificial nails or gel coating may interfere with anesthesia's ability to adequately monitor your vital signs.  Wear Clean/Comfortable clothing the morning of surgery Remember to brush your teeth WITH YOUR REGULAR TOOTHPASTE.   Please read over the following fact sheets that you were given.    If you received a COVID test during your pre-op visit  it is requested that you wear a mask when out in public, stay away from anyone that may not be feeling well and notify your surgeon if you develop symptoms. If you have been in contact with anyone that has tested positive in the last 10 days please notify you surgeon.

## 2022-05-19 ENCOUNTER — Other Ambulatory Visit: Payer: Self-pay

## 2022-05-19 ENCOUNTER — Encounter (HOSPITAL_COMMUNITY)
Admission: RE | Admit: 2022-05-19 | Discharge: 2022-05-19 | Disposition: A | Discharge status: Home / Self Care | Payer: 59 | Source: Ambulatory Visit | Attending: Orthopedic Surgery | Admitting: Orthopedic Surgery

## 2022-05-19 ENCOUNTER — Encounter (HOSPITAL_COMMUNITY): Payer: Self-pay

## 2022-05-19 VITALS — BP 134/88 | HR 118 | Temp 98.1°F | Resp 17 | Ht 66.75 in | Wt 221.4 lb

## 2022-05-19 DIAGNOSIS — E119 Type 2 diabetes mellitus without complications: Secondary | ICD-10-CM | POA: Insufficient documentation

## 2022-05-19 DIAGNOSIS — Z87891 Personal history of nicotine dependence: Secondary | ICD-10-CM | POA: Diagnosis not present

## 2022-05-19 DIAGNOSIS — I10 Essential (primary) hypertension: Secondary | ICD-10-CM | POA: Insufficient documentation

## 2022-05-19 DIAGNOSIS — Z01818 Encounter for other preprocedural examination: Secondary | ICD-10-CM | POA: Diagnosis present

## 2022-05-19 DIAGNOSIS — K219 Gastro-esophageal reflux disease without esophagitis: Secondary | ICD-10-CM | POA: Insufficient documentation

## 2022-05-19 HISTORY — DX: Personal history of other diseases of the digestive system: Z87.19

## 2022-05-19 HISTORY — DX: Gastro-esophageal reflux disease without esophagitis: K21.9

## 2022-05-19 HISTORY — DX: Unspecified osteoarthritis, unspecified site: M19.90

## 2022-05-19 LAB — URINALYSIS, COMPLETE (UACMP) WITH MICROSCOPIC
Bilirubin Urine: NEGATIVE
Glucose, UA: NEGATIVE mg/dL
Hgb urine dipstick: NEGATIVE
Ketones, ur: 5 mg/dL — AB
Nitrite: NEGATIVE
Protein, ur: NEGATIVE mg/dL
Specific Gravity, Urine: 1.019 (ref 1.005–1.030)
pH: 5 (ref 5.0–8.0)

## 2022-05-19 LAB — BASIC METABOLIC PANEL
Anion gap: 11 (ref 5–15)
BUN: 17 mg/dL (ref 8–23)
CO2: 26 mmol/L (ref 22–32)
Calcium: 9.4 mg/dL (ref 8.9–10.3)
Chloride: 99 mmol/L (ref 98–111)
Creatinine, Ser: 1.12 mg/dL — ABNORMAL HIGH (ref 0.44–1.00)
GFR, Estimated: 55 mL/min — ABNORMAL LOW (ref >60)
Glucose, Bld: 148 mg/dL — ABNORMAL HIGH (ref 70–99)
Potassium: 3.8 mmol/L (ref 3.5–5.1)
Sodium: 136 mmol/L (ref 135–145)

## 2022-05-19 LAB — CBC
HCT: 45.2 % (ref 36.0–46.0)
Hemoglobin: 14.6 g/dL (ref 12.0–15.0)
MCH: 30.7 pg (ref 26.0–34.0)
MCHC: 32.3 g/dL (ref 30.0–36.0)
MCV: 95.2 fL (ref 80.0–100.0)
Platelets: 294 10*3/uL (ref 150–400)
RBC: 4.75 MIL/uL (ref 3.87–5.11)
RDW: 13.9 % (ref 11.5–15.5)
WBC: 7 10*3/uL (ref 4.0–10.5)
nRBC: 0 % (ref 0.0–0.2)

## 2022-05-19 LAB — HEMOGLOBIN A1C
Hgb A1c MFr Bld: 7.3 % — ABNORMAL HIGH (ref 4.8–5.6)
Mean Plasma Glucose: 163 mg/dL

## 2022-05-19 LAB — GLUCOSE, CAPILLARY: Glucose-Capillary: 155 mg/dL — ABNORMAL HIGH (ref 70–99)

## 2022-05-19 LAB — SURGICAL PCR SCREEN
MRSA, PCR: NEGATIVE
Staphylococcus aureus: NEGATIVE

## 2022-05-19 NOTE — Progress Notes (Addendum)
PCP - Dr. Crosby Oyster [facility] Cardiologist - Denies  PPM/ICD - Denies Device Orders - n/a Rep Notified - n/a  Chest x-ray - n/a EKG - 05/19/2022 Stress Test - 07/12/2016 - completed for palpitations. Cardiac component r/o. Issues related to stress ECHO - Denies Cardiac Cath - Denies  Sleep Study - Denies CPAP - n/a  Pt is DM2. She checks her blood sugar 2x/day. Normal fasting blood sugar is around 125. CBG at pre-op 155. Pt has had orange juice, a chicken sandwich and water so far today. A1c collected and pending.  Last dose of GLP1 agonist- Last dose of Trulicity [id number]. GLP1 instructions: Pt will not take her dose this Sunday, March 24th prior to surgery  Blood Thinner Instructions: n/a Aspirin Instructions: Last dose of ASA 3/20  ERAS Protcol - Clear liquids until 0430 morning of surgery PRE-SURGERY Ensure or G2- G2 given to pt with instructions  COVID TEST- n/a   Anesthesia review: Yes. Abnormal EKG.  Patient denies shortness of breath, fever, cough and chest pain at PAT appointment. Pt endorses a sinus infection a little over one month ago with symptoms of a runny nose and cough. She self-treated with OTC medications and symptoms went away within a few days. She has been symptom free for about one month.   All instructions explained to the patient, with a verbal understanding of the material. Patient agrees to go over the instructions while at home for a better understanding. Patient also instructed to self quarantine after being tested for COVID-19. The opportunity to ask questions was provided.

## 2022-05-20 NOTE — Progress Notes (Signed)
Anesthesia Chart Review:  62 year old female with pertinent history including HTN, IDDM 2, GERD, former smoker (40-pack-year history, quit 06/2021), hiatal hernia.  Noted to have resting sinus tachycardia at preadmission testing appointment.  EKG shows sinus tachycardia rate 109, low voltage QRS, left posterior fascicular block.  This appears to be chronic, heart rate 105 when seen by PCP Dr. Nani Ravens on 03/16/2022 for preop exam.  Per note at that time, "A1c is 7.7.  She will stop ASA 5 d prior to planned procedure. Asked her to reach out to ortho team to have them fax over pre-op clearance paperwork. She is otherwise medically optimized and deemed low cardiac risk for the proposed procedure."  Preop labs reviewed, creatinine mildly elevated 1.12, IDDM2 reasonably controlled with A1c 7.3.  EKG 05/19/2022: Sinus tachycardia.  Rate 109.  Low voltage QRS.  Left posterior fascicular block.  Cannot rule out anterior infarct, age-indeterminate.  Nuclear stress 07/12/2016: IMPRESSION: 1. No reversible ischemia or infarction.  Moderate apical thinning.   2. Normal left ventricular wall motion.   3. Left ventricular ejection fraction 53%   4. Non invasive risk stratification*: Low risk   [place], [facility] Short Stay [facility]/Anesthesiology Phone [phone] 05/20/2022 1:39 PM

## 2022-05-20 NOTE — Anesthesia Preprocedure Evaluation (Signed)
Anesthesia Evaluation  Patient identified by MRN, date of birth, ID band Patient awake    Reviewed: Allergy & Precautions, NPO status , Patient's Chart, lab work & pertinent test results  History of Anesthesia Complications Negative for: history of anesthetic complications  Airway Mallampati: II  TM Distance: >3 FB Neck ROM: Full    Dental no notable dental hx. (+) Dental Advisory Given   Pulmonary former smoker   Pulmonary exam normal        Cardiovascular hypertension, Pt. on medications Normal cardiovascular exam     Neuro/Psych negative neurological ROS     GI/Hepatic Neg liver ROS,GERD  ,,  Endo/Other  diabetes    Renal/GU negative Renal ROS     Musculoskeletal negative musculoskeletal ROS (+)    Abdominal   Peds  Hematology negative hematology ROS (+)   Anesthesia Other Findings   Reproductive/Obstetrics                             Anesthesia Physical Anesthesia Plan  ASA: 2  Anesthesia Plan: Spinal and MAC   Post-op Pain Management: Regional block* and Tylenol PO (pre-op)*   Induction:   PONV Risk Score and Plan: 3 and Ondansetron, Dexamethasone and Propofol infusion  Airway Management Planned: Natural Airway  Additional Equipment:   Intra-op Plan:   Post-operative Plan:   Informed Consent: I have reviewed the patients History and Physical, chart, labs and discussed the procedure including the risks, benefits and alternatives for the proposed anesthesia with the patient or authorized representative who has indicated his/her understanding and acceptance.     Dental advisory given  Plan Discussed with: Anesthesiologist and CRNA  Anesthesia Plan Comments: (PAT note by Karoline Caldwell, PA-C: 62 year old female with pertinent history including HTN, IDDM 2, GERD, former smoker (40-pack-year history, quit 06/2021), hiatal hernia.  Noted to have resting sinus tachycardia at  preadmission testing appointment.  EKG shows sinus tachycardia rate 109, low voltage QRS, left posterior fascicular block.  This appears to be chronic, heart rate 105 when seen by PCP Dr. Nani Ravens on 03/16/2022 for preop exam.  Per note at that time, "A1c is 7.7.  She will stop ASA 5 d prior to planned procedure. Asked her to reach out to ortho team to have them fax over pre-op clearance paperwork. She is otherwise medically optimized and deemed low cardiac risk for the proposed procedure."  Preop labs reviewed, creatinine mildly elevated 1.12, IDDM2 reasonably controlled with A1c 7.3.  EKG 05/19/2022: Sinus tachycardia.  Rate 109.  Low voltage QRS.  Left posterior fascicular block.  Cannot rule out anterior infarct, age-indeterminate.  Nuclear stress 07/12/2016: IMPRESSION: 1. No reversible ischemia or infarction.  Moderate apical thinning.  2. Normal left ventricular wall motion.  3. Left ventricular ejection fraction 53%  4. Non invasive risk stratification*: Low risk  )        Anesthesia Quick Evaluation

## 2022-05-21 LAB — URINE CULTURE: Culture: NO GROWTH

## 2022-05-24 ENCOUNTER — Telehealth: Payer: Self-pay | Admitting: Surgical

## 2022-05-24 NOTE — Telephone Encounter (Signed)
Patient looking to speak to someone to chang a date on a form, states the date was entered incorrectly. Please call . The date needs to be two weeks from the 28th. Fax dates to [employer]Marland Kitchen

## 2022-05-25 ENCOUNTER — Telehealth: Payer: Self-pay | Admitting: Orthopedic Surgery

## 2022-05-25 NOTE — Telephone Encounter (Signed)
Patient left voicemail message to return call.  Called patient's phone and call went to voicemail.  Voicemail is full and unable to leave message.

## 2022-05-25 NOTE — Telephone Encounter (Signed)
Signing off.

## 2022-05-26 ENCOUNTER — Other Ambulatory Visit: Payer: Self-pay

## 2022-05-26 ENCOUNTER — Encounter (HOSPITAL_COMMUNITY): Payer: Self-pay | Admitting: Orthopedic Surgery

## 2022-05-26 ENCOUNTER — Encounter (HOSPITAL_COMMUNITY)
Admission: RE | Disposition: A | Discharge status: Home / Self Care | Payer: Self-pay | Source: Home / Self Care | Attending: Orthopedic Surgery

## 2022-05-26 ENCOUNTER — Observation Stay (HOSPITAL_COMMUNITY)
Admission: RE | Admit: 2022-05-26 | Discharge: 2022-05-27 | Disposition: A | Discharge status: Home / Self Care | Payer: 59 | Attending: Orthopedic Surgery | Admitting: Orthopedic Surgery

## 2022-05-26 ENCOUNTER — Ambulatory Visit (HOSPITAL_COMMUNITY): Payer: 59 | Admitting: Physician Assistant

## 2022-05-26 ENCOUNTER — Ambulatory Visit (HOSPITAL_BASED_OUTPATIENT_CLINIC_OR_DEPARTMENT_OTHER): Payer: 59 | Admitting: Registered Nurse

## 2022-05-26 DIAGNOSIS — Z87891 Personal history of nicotine dependence: Secondary | ICD-10-CM | POA: Insufficient documentation

## 2022-05-26 DIAGNOSIS — I10 Essential (primary) hypertension: Secondary | ICD-10-CM | POA: Diagnosis not present

## 2022-05-26 DIAGNOSIS — Z01818 Encounter for other preprocedural examination: Secondary | ICD-10-CM

## 2022-05-26 DIAGNOSIS — M1711 Unilateral primary osteoarthritis, right knee: Secondary | ICD-10-CM

## 2022-05-26 DIAGNOSIS — E119 Type 2 diabetes mellitus without complications: Secondary | ICD-10-CM

## 2022-05-26 DIAGNOSIS — Z96651 Presence of right artificial knee joint: Secondary | ICD-10-CM

## 2022-05-26 HISTORY — PX: TOTAL KNEE ARTHROPLASTY: SHX125

## 2022-05-26 LAB — GLUCOSE, CAPILLARY
Glucose-Capillary: 198 mg/dL — ABNORMAL HIGH (ref 70–99)
Glucose-Capillary: 153 mg/dL — ABNORMAL HIGH (ref 70–99)
Glucose-Capillary: 184 mg/dL — ABNORMAL HIGH (ref 70–99)
Glucose-Capillary: 208 mg/dL — ABNORMAL HIGH (ref 70–99)
Glucose-Capillary: 281 mg/dL — ABNORMAL HIGH (ref 70–99)
Glucose-Capillary: 305 mg/dL — ABNORMAL HIGH (ref 70–99)

## 2022-05-26 SURGERY — ARTHROPLASTY, KNEE, TOTAL
Anesthesia: Monitor Anesthesia Care | Site: Knee | Laterality: Right

## 2022-05-26 MED ORDER — ACETAMINOPHEN 325 MG PO TABS
325.0000 mg | ORAL_TABLET | Freq: Four times a day (QID) | ORAL | Status: DC | PRN
  Filled 2022-05-26: qty 2

## 2022-05-26 MED ORDER — ONDANSETRON HCL 4 MG/2ML IJ SOLN
INTRAMUSCULAR | Status: DC | PRN
  Administered 2022-05-26: 4 mg via INTRAVENOUS

## 2022-05-26 MED ORDER — FENTANYL CITRATE (PF) 100 MCG/2ML IJ SOLN
25.0000 ug | INTRAMUSCULAR | Status: DC | PRN
  Administered 2022-05-26 (×3): 25 ug via INTRAVENOUS

## 2022-05-26 MED ORDER — HYDROCHLOROTHIAZIDE 25 MG PO TABS
25.0000 mg | ORAL_TABLET | Freq: Every day | ORAL | Status: DC
  Administered 2022-05-26 – 2022-05-27 (×2): 25 mg via ORAL
  Filled 2022-05-26 (×2): qty 1

## 2022-05-26 MED ORDER — PROMETHAZINE HCL 25 MG/ML IJ SOLN: 6.2500 mg | INTRAMUSCULAR | Status: DC | PRN

## 2022-05-26 MED ORDER — MIDAZOLAM HCL 2 MG/2ML IJ SOLN
INTRAMUSCULAR | Status: AC
  Filled 2022-05-26: qty 2

## 2022-05-26 MED ORDER — CHLORHEXIDINE GLUCONATE 0.12 % MT SOLN
15.0000 mL | Freq: Once | OROMUCOSAL | Status: AC
  Administered 2022-05-26: 15 mL via OROMUCOSAL
  Filled 2022-05-26: qty 15

## 2022-05-26 MED ORDER — MIDAZOLAM HCL 2 MG/2ML IJ SOLN
INTRAMUSCULAR | Status: DC | PRN
  Administered 2022-05-26: 2 mg via INTRAVENOUS

## 2022-05-26 MED ORDER — DOCUSATE SODIUM 100 MG PO CAPS
100.0000 mg | ORAL_CAPSULE | Freq: Two times a day (BID) | ORAL | Status: DC
  Administered 2022-05-26 – 2022-05-27 (×2): 100 mg via ORAL
  Filled 2022-05-26 (×2): qty 1

## 2022-05-26 MED ORDER — LACTATED RINGERS IV SOLN: INTRAVENOUS | Status: DC

## 2022-05-26 MED ORDER — 0.9 % SODIUM CHLORIDE (POUR BTL) OPTIME
TOPICAL | Status: DC | PRN
  Administered 2022-05-26: 5000 mL

## 2022-05-26 MED ORDER — SODIUM CHLORIDE (PF) 0.9 % IJ SOLN
INTRAMUSCULAR | Status: DC | PRN
  Administered 2022-05-26: 60 mL via INTRAMUSCULAR

## 2022-05-26 MED ORDER — PROPOFOL 10 MG/ML IV BOLUS
INTRAVENOUS | Status: AC
  Filled 2022-05-26: qty 20

## 2022-05-26 MED ORDER — METHOCARBAMOL 1000 MG/10ML IJ SOLN: 500.0000 mg | Freq: Four times a day (QID) | INTRAVENOUS | Status: DC | PRN

## 2022-05-26 MED ORDER — VANCOMYCIN HCL 1000 MG IV SOLR
INTRAVENOUS | Status: DC | PRN
  Administered 2022-05-26: 1000 mg via TOPICAL

## 2022-05-26 MED ORDER — INSULIN ASPART 100 UNIT/ML IJ SOLN
0.0000 [IU] | INTRAMUSCULAR | Status: DC | PRN
  Administered 2022-05-26: 4 [IU] via SUBCUTANEOUS

## 2022-05-26 MED ORDER — PHENYLEPHRINE HCL-NACL 20-0.9 MG/250ML-% IV SOLN
INTRAVENOUS | Status: DC | PRN
  Administered 2022-05-26: 25 ug/min via INTRAVENOUS

## 2022-05-26 MED ORDER — ACETAMINOPHEN 500 MG PO TABS
1000.0000 mg | ORAL_TABLET | Freq: Four times a day (QID) | ORAL | Status: AC
  Administered 2022-05-26 – 2022-05-27 (×3): 1000 mg via ORAL
  Filled 2022-05-26 (×2): qty 2

## 2022-05-26 MED ORDER — METOCLOPRAMIDE HCL 5 MG PO TABS: 5.0000 mg | ORAL_TABLET | Freq: Three times a day (TID) | ORAL | Status: DC | PRN

## 2022-05-26 MED ORDER — PHENYLEPHRINE 80 MCG/ML (10ML) SYRINGE FOR IV PUSH (FOR BLOOD PRESSURE SUPPORT)
PREFILLED_SYRINGE | INTRAVENOUS | Status: DC | PRN
  Administered 2022-05-26 (×4): 80 ug via INTRAVENOUS

## 2022-05-26 MED ORDER — FENTANYL CITRATE (PF) 100 MCG/2ML IJ SOLN
INTRAMUSCULAR | Status: AC
  Filled 2022-05-26: qty 2

## 2022-05-26 MED ORDER — INSULIN ASPART 100 UNIT/ML IJ SOLN
4.0000 [IU] | Freq: Three times a day (TID) | INTRAMUSCULAR | Status: DC
  Administered 2022-05-26 – 2022-05-27 (×2): 4 [IU] via SUBCUTANEOUS

## 2022-05-26 MED ORDER — METHOCARBAMOL 500 MG PO TABS: 500.0000 mg | ORAL_TABLET | Freq: Four times a day (QID) | ORAL | Status: DC | PRN

## 2022-05-26 MED ORDER — FENTANYL CITRATE (PF) 250 MCG/5ML IJ SOLN
INTRAMUSCULAR | Status: DC | PRN
  Administered 2022-05-26: 100 ug via INTRAVENOUS

## 2022-05-26 MED ORDER — ALBUTEROL SULFATE (2.5 MG/3ML) 0.083% IN NEBU: 2.5000 mg | INHALATION_SOLUTION | Freq: Four times a day (QID) | RESPIRATORY_TRACT | Status: DC | PRN

## 2022-05-26 MED ORDER — ACETAMINOPHEN 500 MG PO TABS
1000.0000 mg | ORAL_TABLET | Freq: Once | ORAL | Status: AC
  Administered 2022-05-26: 1000 mg via ORAL
  Filled 2022-05-26: qty 2

## 2022-05-26 MED ORDER — DEXAMETHASONE SODIUM PHOSPHATE 10 MG/ML IJ SOLN
INTRAMUSCULAR | Status: DC | PRN
  Administered 2022-05-26: 5 mg

## 2022-05-26 MED ORDER — ONDANSETRON HCL 4 MG/2ML IJ SOLN: 4.0000 mg | Freq: Four times a day (QID) | INTRAMUSCULAR | Status: DC | PRN

## 2022-05-26 MED ORDER — IRRISEPT - 450ML BOTTLE WITH 0.05% CHG IN STERILE WATER, USP 99.95% OPTIME
TOPICAL | Status: DC | PRN
  Administered 2022-05-26: 450 mL via TOPICAL

## 2022-05-26 MED ORDER — DEXAMETHASONE SODIUM PHOSPHATE 10 MG/ML IJ SOLN
INTRAMUSCULAR | Status: AC
  Filled 2022-05-26: qty 1

## 2022-05-26 MED ORDER — ONDANSETRON HCL 4 MG/2ML IJ SOLN
INTRAMUSCULAR | Status: AC
  Filled 2022-05-26: qty 2

## 2022-05-26 MED ORDER — SODIUM CHLORIDE 0.9 % IR SOLN
Status: DC | PRN
  Administered 2022-05-26: 3000 mL

## 2022-05-26 MED ORDER — LIDOCAINE 2% (20 MG/ML) 5 ML SYRINGE
INTRAMUSCULAR | Status: AC
  Filled 2022-05-26: qty 5

## 2022-05-26 MED ORDER — ALBUTEROL SULFATE HFA 108 (90 BASE) MCG/ACT IN AERS: 2.0000 | INHALATION_SPRAY | Freq: Four times a day (QID) | RESPIRATORY_TRACT | Status: DC | PRN

## 2022-05-26 MED ORDER — FENTANYL CITRATE (PF) 250 MCG/5ML IJ SOLN
INTRAMUSCULAR | Status: AC
  Filled 2022-05-26: qty 5

## 2022-05-26 MED ORDER — TRANEXAMIC ACID 1000 MG/10ML IV SOLN
INTRAVENOUS | Status: DC | PRN
  Administered 2022-05-26: 2000 mg via TOPICAL

## 2022-05-26 MED ORDER — LISINOPRIL 20 MG PO TABS
40.0000 mg | ORAL_TABLET | Freq: Every day | ORAL | Status: DC
  Filled 2022-05-26: qty 2

## 2022-05-26 MED ORDER — CEFAZOLIN SODIUM-DEXTROSE 2-4 GM/100ML-% IV SOLN
2.0000 g | INTRAVENOUS | Status: AC
  Administered 2022-05-26: 2 g via INTRAVENOUS
  Filled 2022-05-26: qty 100

## 2022-05-26 MED ORDER — POVIDONE-IODINE 10 % EX SWAB
2.0000 | Freq: Once | CUTANEOUS | Status: AC
  Administered 2022-05-26: 2 via TOPICAL

## 2022-05-26 MED ORDER — CLONIDINE HCL (ANALGESIA) 100 MCG/ML EP SOLN
EPIDURAL | Status: AC
  Filled 2022-05-26: qty 10

## 2022-05-26 MED ORDER — ASPIRIN 81 MG PO CHEW
81.0000 mg | CHEWABLE_TABLET | Freq: Two times a day (BID) | ORAL | Status: DC
  Administered 2022-05-27: 81 mg via ORAL
  Filled 2022-05-26: qty 1

## 2022-05-26 MED ORDER — INSULIN ASPART 100 UNIT/ML IJ SOLN
0.0000 [IU] | Freq: Every day | INTRAMUSCULAR | Status: DC
  Administered 2022-05-26: 4 [IU] via SUBCUTANEOUS

## 2022-05-26 MED ORDER — MORPHINE SULFATE 4 MG/ML IJ SOLN
INTRAMUSCULAR | Status: DC | PRN
  Administered 2022-05-26: 13 mL via INTRAMUSCULAR

## 2022-05-26 MED ORDER — GLYCOPYRROLATE PF 0.2 MG/ML IJ SOSY
PREFILLED_SYRINGE | INTRAMUSCULAR | Status: DC | PRN
  Administered 2022-05-26 (×2): .1 mg via INTRAVENOUS

## 2022-05-26 MED ORDER — METOCLOPRAMIDE HCL 5 MG/ML IJ SOLN: 5.0000 mg | Freq: Three times a day (TID) | INTRAMUSCULAR | Status: DC | PRN

## 2022-05-26 MED ORDER — PHENOL 1.4 % MT LIQD: 1.0000 | OROMUCOSAL | Status: DC | PRN

## 2022-05-26 MED ORDER — POVIDONE-IODINE 7.5 % EX SOLN
Freq: Once | CUTANEOUS | Status: DC
  Filled 2022-05-26: qty 118

## 2022-05-26 MED ORDER — HYDROMORPHONE HCL 1 MG/ML IJ SOLN: 0.5000 mg | INTRAMUSCULAR | Status: DC | PRN

## 2022-05-26 MED ORDER — BUPIVACAINE IN DEXTROSE 0.75-8.25 % IT SOLN
INTRATHECAL | Status: DC | PRN
  Administered 2022-05-26: 1.8 mL via INTRATHECAL

## 2022-05-26 MED ORDER — PROPOFOL 10 MG/ML IV BOLUS
INTRAVENOUS | Status: DC | PRN
  Administered 2022-05-26: 20 mg via INTRAVENOUS
  Administered 2022-05-26: 100 ug/kg/min via INTRAVENOUS

## 2022-05-26 MED ORDER — CEFAZOLIN SODIUM-DEXTROSE 2-4 GM/100ML-% IV SOLN
2.0000 g | Freq: Three times a day (TID) | INTRAVENOUS | Status: AC
  Administered 2022-05-26 (×2): 2 g via INTRAVENOUS
  Filled 2022-05-26 (×2): qty 100

## 2022-05-26 MED ORDER — ORAL CARE MOUTH RINSE: 15.0000 mL | Freq: Once | OROMUCOSAL | Status: AC

## 2022-05-26 MED ORDER — NAPROXEN 250 MG PO TABS
250.0000 mg | ORAL_TABLET | Freq: Two times a day (BID) | ORAL | Status: DC
  Administered 2022-05-26 – 2022-05-27 (×2): 250 mg via ORAL
  Filled 2022-05-26 (×2): qty 1

## 2022-05-26 MED ORDER — INSULIN ASPART 100 UNIT/ML IJ SOLN
0.0000 [IU] | Freq: Three times a day (TID) | INTRAMUSCULAR | Status: DC
  Administered 2022-05-26: 8 [IU] via SUBCUTANEOUS
  Administered 2022-05-26: 5 [IU] via SUBCUTANEOUS
  Administered 2022-05-27: 8 [IU] via SUBCUTANEOUS

## 2022-05-26 MED ORDER — TRANEXAMIC ACID-NACL 1000-0.7 MG/100ML-% IV SOLN
1000.0000 mg | INTRAVENOUS | Status: AC
  Administered 2022-05-26: 1000 mg via INTRAVENOUS
  Filled 2022-05-26: qty 100

## 2022-05-26 MED ORDER — METFORMIN HCL ER 500 MG PO TB24: 1000.0000 mg | ORAL_TABLET | Freq: Two times a day (BID) | ORAL | Status: DC

## 2022-05-26 MED ORDER — ROPIVACAINE HCL 7.5 MG/ML IJ SOLN
INTRAMUSCULAR | Status: DC | PRN
  Administered 2022-05-26: 20 mL via PERINEURAL

## 2022-05-26 MED ORDER — BUPIVACAINE HCL (PF) 0.25 % IJ SOLN
INTRAMUSCULAR | Status: AC
  Filled 2022-05-26: qty 30

## 2022-05-26 MED ORDER — OXYCODONE HCL 5 MG PO TABS
5.0000 mg | ORAL_TABLET | ORAL | Status: DC | PRN
  Administered 2022-05-26 – 2022-05-27 (×3): 10 mg via ORAL
  Filled 2022-05-26 (×3): qty 2

## 2022-05-26 MED ORDER — PANTOPRAZOLE SODIUM 40 MG PO TBEC
40.0000 mg | DELAYED_RELEASE_TABLET | Freq: Every day | ORAL | Status: DC
  Administered 2022-05-27: 40 mg via ORAL
  Filled 2022-05-26: qty 1

## 2022-05-26 MED ORDER — EPHEDRINE 5 MG/ML INJ
INTRAVENOUS | Status: AC
  Filled 2022-05-26: qty 5

## 2022-05-26 MED ORDER — GLYCOPYRROLATE PF 0.2 MG/ML IJ SOSY
PREFILLED_SYRINGE | INTRAMUSCULAR | Status: AC
  Filled 2022-05-26: qty 1

## 2022-05-26 MED ORDER — AMLODIPINE BESYLATE 5 MG PO TABS
5.0000 mg | ORAL_TABLET | Freq: Every day | ORAL | Status: DC
  Filled 2022-05-26: qty 1

## 2022-05-26 MED ORDER — INSULIN ASPART 100 UNIT/ML IJ SOLN
INTRAMUSCULAR | Status: AC
  Filled 2022-05-26: qty 1

## 2022-05-26 MED ORDER — ONDANSETRON HCL 4 MG PO TABS: 4.0000 mg | ORAL_TABLET | Freq: Four times a day (QID) | ORAL | Status: DC | PRN

## 2022-05-26 MED ORDER — BUPIVACAINE LIPOSOME 1.3 % IJ SUSP
INTRAMUSCULAR | Status: AC
  Filled 2022-05-26: qty 20

## 2022-05-26 MED ORDER — PHENYLEPHRINE 80 MCG/ML (10ML) SYRINGE FOR IV PUSH (FOR BLOOD PRESSURE SUPPORT)
PREFILLED_SYRINGE | INTRAVENOUS | Status: AC
  Filled 2022-05-26: qty 10

## 2022-05-26 MED ORDER — EPHEDRINE SULFATE-NACL 50-0.9 MG/10ML-% IV SOSY
PREFILLED_SYRINGE | INTRAVENOUS | Status: DC | PRN
  Administered 2022-05-26 (×2): 5 mg via INTRAVENOUS

## 2022-05-26 MED ORDER — VANCOMYCIN HCL 1000 MG IV SOLR
INTRAVENOUS | Status: AC
  Filled 2022-05-26: qty 20

## 2022-05-26 MED ORDER — MENTHOL 3 MG MT LOZG: 1.0000 | LOZENGE | OROMUCOSAL | Status: DC | PRN

## 2022-05-26 MED ORDER — AMISULPRIDE (ANTIEMETIC) 5 MG/2ML IV SOLN: 10.0000 mg | Freq: Once | INTRAVENOUS | Status: DC | PRN

## 2022-05-26 MED ORDER — MORPHINE SULFATE (PF) 4 MG/ML IV SOLN
INTRAVENOUS | Status: AC
  Filled 2022-05-26: qty 2

## 2022-05-26 MED ORDER — TRANEXAMIC ACID 1000 MG/10ML IV SOLN
2000.0000 mg | INTRAVENOUS | Status: DC
  Filled 2022-05-26: qty 20

## 2022-05-26 MED ORDER — SODIUM CHLORIDE 0.9 % IV SOLN: INTRAVENOUS | Status: AC

## 2022-05-26 SURGICAL SUPPLY — 97 items
BAG COUNTER SPONGE SURGICOUNT (BAG) ×1 IMPLANT
BAG DECANTER FOR FLEXI CONT (MISCELLANEOUS) ×1 IMPLANT
BAG SPNG CNTER NS LX DISP (BAG) ×1
BANDAGE ESMARK 6X9 LF (GAUZE/BANDAGES/DRESSINGS) ×1 IMPLANT
BLADE SAG 18X100X1.27 (BLADE) ×1 IMPLANT
BLADE SAGITTAL (BLADE) ×1
BLADE SAW THK.89X75X18XSGTL (BLADE) ×1 IMPLANT
BNDG CMPR 5X3 CHSV STRCH STRL (GAUZE/BANDAGES/DRESSINGS) ×1
BNDG CMPR 5X6 CHSV STRCH STRL (GAUZE/BANDAGES/DRESSINGS) ×1
BNDG CMPR 9X6 STRL LF SNTH (GAUZE/BANDAGES/DRESSINGS) ×1
BNDG CMPR MED 15X6 ELC VLCR LF (GAUZE/BANDAGES/DRESSINGS) ×1
BNDG COHESIVE 3X5 TAN ST LF (GAUZE/BANDAGES/DRESSINGS) IMPLANT
BNDG COHESIVE 6X5 TAN ST LF (GAUZE/BANDAGES/DRESSINGS) ×1 IMPLANT
BNDG ELASTIC 6X15 VLCR STRL LF (GAUZE/BANDAGES/DRESSINGS) ×1 IMPLANT
BNDG ESMARK 6X9 LF (GAUZE/BANDAGES/DRESSINGS) ×1
BOWL SMART MIX CTS (DISPOSABLE) IMPLANT
BSPLAT TIB 5D E CMNT KN RT (Knees) ×1 IMPLANT
CEMENT BONE SIMPLEX SPEEDSET (Cement) IMPLANT
CNTNR URN SCR LID CUP LEK RST (MISCELLANEOUS) ×1 IMPLANT
COMP FEM CEMT PERS SZ7 RT (Joint) ×1 IMPLANT
COMPONENT FEM CEMT PERS SZ7 RT (Joint) IMPLANT
CONT SPEC 4OZ STRL OR WHT (MISCELLANEOUS) ×1
COOLER ICEMAN CLASSIC (MISCELLANEOUS) IMPLANT
COVER SURGICAL LIGHT HANDLE (MISCELLANEOUS) ×1 IMPLANT
CUFF TOURN SGL QUICK 34 (TOURNIQUET CUFF) ×1
CUFF TOURN SGL QUICK 42 (TOURNIQUET CUFF) IMPLANT
CUFF TRNQT CYL 34X4.125X (TOURNIQUET CUFF) ×1 IMPLANT
DRAPE INCISE IOBAN 66X45 STRL (DRAPES) IMPLANT
DRAPE ORTHO SPLIT 77X108 STRL (DRAPES) ×3
DRAPE SURG ORHT 6 SPLT 77X108 (DRAPES) ×3 IMPLANT
DRAPE U-SHAPE 47X51 STRL (DRAPES) ×1 IMPLANT
DRSG AQUACEL AG ADV 3.5X14 (GAUZE/BANDAGES/DRESSINGS) IMPLANT
DURAPREP 26ML APPLICATOR (WOUND CARE) ×2 IMPLANT
ELECT CAUTERY BLADE 6.4 (BLADE) ×1 IMPLANT
ELECT REM PT RETURN 9FT ADLT (ELECTROSURGICAL) ×1
ELECTRODE REM PT RTRN 9FT ADLT (ELECTROSURGICAL) ×1 IMPLANT
GAUZE SPONGE 4X4 12PLY STRL (GAUZE/BANDAGES/DRESSINGS) ×1 IMPLANT
GLOVE BIOGEL PI IND STRL 7.0 (GLOVE) ×1 IMPLANT
GLOVE BIOGEL PI IND STRL 8 (GLOVE) ×1 IMPLANT
GLOVE BIOGEL PI IND STRL 9 (GLOVE) IMPLANT
GLOVE ECLIPSE 7.0 STRL STRAW (GLOVE) ×1 IMPLANT
GLOVE ECLIPSE 8.0 STRL XLNG CF (GLOVE) ×1 IMPLANT
GLOVE SURG ENC MOIS LTX SZ6.5 (GLOVE) ×3 IMPLANT
GOWN STRL REUS W/ TWL LRG LVL3 (GOWN DISPOSABLE) ×3 IMPLANT
GOWN STRL REUS W/TWL LRG LVL3 (GOWN DISPOSABLE) ×3
HANDPIECE INTERPULSE COAX TIP (DISPOSABLE) ×1
HDLS TROCR DRIL PIN KNEE 75 (PIN) ×1
HOOD PEEL AWAY T7 (MISCELLANEOUS) ×3 IMPLANT
IMMOBILIZER KNEE 20 (SOFTGOODS)
IMMOBILIZER KNEE 20 THIGH 36 (SOFTGOODS) IMPLANT
IMMOBILIZER KNEE 22 UNIV (SOFTGOODS) IMPLANT
IMMOBILIZER KNEE 24 THIGH 36 (MISCELLANEOUS) IMPLANT
IMMOBILIZER KNEE 24 UNIV (MISCELLANEOUS)
INSERT TIB ARTISURF SZ6-7 R 11 (Joint) IMPLANT
JET LAVAGE IRRISEPT WOUND (IRRIGATION / IRRIGATOR) ×1
KIT BASIN OR (CUSTOM PROCEDURE TRAY) ×1 IMPLANT
KIT TURNOVER KIT B (KITS) ×1 IMPLANT
LAVAGE JET IRRISEPT WOUND (IRRIGATION / IRRIGATOR) IMPLANT
MANIFOLD NEPTUNE II (INSTRUMENTS) ×1 IMPLANT
NDL 18GX1X1/2 (RX/OR ONLY) (NEEDLE) IMPLANT
NDL 22X1.5 STRL (OR ONLY) (MISCELLANEOUS) ×2 IMPLANT
NDL SPNL 18GX3.5 QUINCKE PK (NEEDLE) ×1 IMPLANT
NEEDLE 18GX1X1/2 (RX/OR ONLY) (NEEDLE) ×1 IMPLANT
NEEDLE 22X1.5 STRL (OR ONLY) (MISCELLANEOUS) ×2 IMPLANT
NEEDLE SPNL 18GX3.5 QUINCKE PK (NEEDLE) ×1 IMPLANT
NS IRRIG 1000ML POUR BTL (IV SOLUTION) ×2 IMPLANT
PACK TOTAL JOINT (CUSTOM PROCEDURE TRAY) ×1 IMPLANT
PAD ARMBOARD 7.5X6 YLW CONV (MISCELLANEOUS) ×2 IMPLANT
PAD CAST 4YDX4 CTTN HI CHSV (CAST SUPPLIES) ×1 IMPLANT
PAD COLD SHLDR WRAP-ON (PAD) IMPLANT
PADDING CAST COTTON 4X4 STRL (CAST SUPPLIES)
PADDING CAST COTTON 6X4 STRL (CAST SUPPLIES) ×1 IMPLANT
PIN DRILL HDLS TROCAR 75 4PK (PIN) IMPLANT
SCREW FEMALE HEX FIX 25X2.5 (ORTHOPEDIC DISPOSABLE SUPPLIES) IMPLANT
SET HNDPC FAN SPRY TIP SCT (DISPOSABLE) ×1 IMPLANT
SPIKE FLUID TRANSFER (MISCELLANEOUS) ×1 IMPLANT
STEM POLY PAT PLY 32M KNEE (Knees) IMPLANT
STEM TIB ST PERS 14+30 (Stem) IMPLANT
STEM TIBIA 5 DEG SZ E R KNEE (Knees) IMPLANT
STRIP CLOSURE SKIN 1/2X4 (GAUZE/BANDAGES/DRESSINGS) ×2 IMPLANT
SUCTION FRAZIER HANDLE 10FR (MISCELLANEOUS) ×1
SUCTION TUBE FRAZIER 10FR DISP (MISCELLANEOUS) ×1 IMPLANT
SUT MNCRL AB 3-0 PS2 18 (SUTURE) ×1 IMPLANT
SUT VIC AB 0 CT1 27 (SUTURE) ×5
SUT VIC AB 0 CT1 27XBRD ANBCTR (SUTURE) ×3 IMPLANT
SUT VIC AB 1 CT1 36 (SUTURE) ×5 IMPLANT
SUT VIC AB 2-0 CT1 27 (SUTURE) ×5
SUT VIC AB 2-0 CT1 TAPERPNT 27 (SUTURE) ×4 IMPLANT
SYR 10ML LL (SYRINGE) IMPLANT
SYR 30ML LL (SYRINGE) ×3 IMPLANT
SYR TB 1ML LUER SLIP (SYRINGE) ×1 IMPLANT
TIBIA STEM 5 DEG SZ E R KNEE (Knees) ×1 IMPLANT
TOWEL GREEN STERILE (TOWEL DISPOSABLE) ×2 IMPLANT
TOWEL GREEN STERILE FF (TOWEL DISPOSABLE) ×2 IMPLANT
TRAY CATH INTERMITTENT SS 16FR (CATHETERS) IMPLANT
WATER STERILE IRR 1000ML POUR (IV SOLUTION) IMPLANT
YANKAUER SUCT BULB TIP NO VENT (SUCTIONS) ×1 IMPLANT

## 2022-05-26 NOTE — Progress Notes (Signed)
Orthopedic Tech Progress Note Patient Details:  Patricia Shelton 1960-04-15 [id number]  CPM placed in PACU. Pt educated on use of bone foam.  CPM Right Knee CPM Right Knee: On Right Knee Flexion (Degrees): 40 Right Knee Extension (Degrees): 10  Post Interventions Patient Tolerated: Well Instructions Provided: Care of device Ortho Devices Type of Ortho Device: Bone foam zero knee Ortho Device/Splint Location: for RLE when out of CPM Ortho Device/Splint Interventions: Ordered   Post Interventions Patient Tolerated: Well Instructions Provided: Care of device  [name] 05/26/2022, 11:59 AM

## 2022-05-26 NOTE — H&P (Signed)
TOTAL KNEE ADMISSION H&P  Patient is being admitted for right total knee arthroplasty.  Subjective:  Chief Complaint:right knee pain.  HPI: Patricia Shelton, 62 y.o. female, has a history of pain and functional disability in the right knee due to arthritis and has failed non-surgical conservative treatments for greater than 12 weeks to includeNSAID's and/or analgesics, corticosteriod injections, flexibility and strengthening excercises, and activity modification.  Onset of symptoms was gradual, starting >10 years ago with gradually worsening course since that time. The patient noted no past surgery on the right knee(s).  Patient currently rates pain in the right knee(s) at 8 out of 10 with activity. Patient has night pain, worsening of pain with activity and weight bearing, pain that interferes with activities of daily living, pain with passive range of motion, crepitus, and joint swelling.  Patient has evidence of subchondral sclerosis and joint space narrowing by imaging studies. This patient has had  extensive conservative treatment . There is no active infection. No personal or family history of dvt or pe.  Patient Active Problem List   Diagnosis Date Noted   Unilateral primary osteoarthritis, right knee 11/12/2021   Gastroesophageal reflux disease 03/17/2021   Chronic pain of right knee 12/13/2019   Tobacco abuse 01/18/2018   Chest pain 07/11/2016   Essential hypertension 07/11/2016   Diabetes mellitus type 2 in obese ([place]) 07/11/2016   Past Medical History:  Diagnosis Date   Arthritis    Diabetes mellitus without complication (HCC)    Type 2   GERD (gastroesophageal reflux disease)    High cholesterol    History of chicken pox    History of hiatal hernia    Hypertension    Obesity    Tobacco abuse     Past Surgical History:  Procedure Laterality Date   ABDOMINAL HYSTERECTOMY  2009   APPENDECTOMY  2006   CHOLECYSTECTOMY  2006   COLONOSCOPY  2021   Removed fatty tumor from back       Current Facility-Administered Medications  Medication Dose Route Frequency Provider Last Rate Last Admin   ceFAZolin (ANCEF) IVPB 2g/100 mL premix  2 g Intravenous On Call to OR Magnant, Charles L, PA-C       insulin aspart (novoLOG) 100 UNIT/ML injection            insulin aspart (novoLOG) injection 0-14 Units  0-14 Units Subcutaneous Q2H PRN Duane Boston, MD   4 Units at 05/26/22 0606   lactated ringers infusion   Intravenous Continuous Suzette Battiest, MD       povidone-iodine (BETADINE) 7.5 % scrub   Topical Once Magnant, Charles L, PA-C       tranexamic acid (CYKLOKAPRON) 2,000 mg in sodium chloride 0.9 % 50 mL Topical Application  123XX123 mg Topical To OR Magnant, Charles L, PA-C       tranexamic acid (CYKLOKAPRON) IVPB 1,000 mg  1,000 mg Intravenous To OR Magnant, Charles L, PA-C       Allergies  Allergen Reactions   Sulfa Antibiotics Nausea And Vomiting    Social History   Tobacco Use   Smoking status: Former    Packs/day: 1.00    Years: 40.00    Additional pack years: 0.00    Total pack years: 40.00    Types: Cigarettes    Quit date: 06/2021    Years since quitting: 0.9   Smokeless tobacco: Never   Tobacco comments:    smoked 1.5 ppd for ~ 40 yrs - recently cut down to  1/2 ppd.  Substance Use Topics   Alcohol use: Yes    Comment: occ    Family History  Problem Relation Age of Onset   Other Mother        valvular heart disease from Rheumatic heart disease - s/p bioprosthetic mvr/avr   Cancer Mother        Breast   CAD Maternal Grandmother        CABG   Diabetes Maternal Grandfather      Review of Systems  Musculoskeletal:  Positive for arthralgias.  All other systems reviewed and are negative.   Objective:  Physical Exam Vitals reviewed.  HENT:     Head: Normocephalic.     Nose: Nose normal.     Mouth/Throat:     Mouth: Mucous membranes are moist.  Cardiovascular:     Rate and Rhythm: Normal rate.     Pulses: Normal pulses.  Pulmonary:      Effort: Pulmonary effort is normal.  Abdominal:     General: Abdomen is flat.  Musculoskeletal:     Cervical back: Normal range of motion.  Skin:    General: Skin is warm.     Capillary Refill: Capillary refill takes less than 2 seconds.  Neurological:     General: No focal deficit present.     Mental Status: She is alert.   : Patient has antalgic gait to the right.  No effusion in the right knee.  Range of motion 0-1 20 with medial greater than lateral joint line tenderness.  Extensor mechanism intact.  Collateral and cruciate ligaments feel stable.  No groin pain with internal/external Tatian of the leg.  No other masses lymphadenopathy or skin changes noted in that right knee region. Bilateral abi wnl Vital signs in last 24 hours: Temp:  [98.2 F (36.8 C)] 98.2 F (36.8 C) (03/28 0544) Pulse Rate:  [84] 84 (03/28 0544) Resp:  [20] 20 (03/28 0544) BP: (124)/(84) 124/84 (03/28 0544) SpO2:  [94 %] 94 % (03/28 0544) Weight:  [100.7 kg] 100.7 kg (03/28 0544)  Labs:   Estimated body mass index is 35.83 kg/m as calculated from the following:   Height as of this encounter: 5\' 6"  (1.676 m).   Weight as of this encounter: 100.7 kg.   Imaging Review Plain radiographs demonstrate severe degenerative joint disease of the right knee(s). The overall alignment ismild varus. The bone quality appears to be good for age and reported activity level.      Assessment/Plan:  End stage arthritis, right knee   The patient history, physical examination, clinical judgment of the provider and imaging studies are consistent with end stage degenerative joint disease of the right knee(s) and total knee arthroplasty is deemed medically necessary. The treatment options including medical management, injection therapy arthroscopy and arthroplasty were discussed at length. The risks and benefits of total knee arthroplasty were presented and reviewed. The risks due to aseptic loosening, infection,  stiffness, patella tracking problems, thromboembolic complications and other imponderables were discussed. The patient acknowledged the explanation, agreed to proceed with the plan and consent was signed. Patient is being admitted for inpatient treatment for surgery, pain control, PT, OT, prophylactic antibiotics, VTE prophylaxis, progressive ambulation and ADL's and discharge planning. The patient is planning to be discharged home with home health services     Patient's anticipated LOS is less than 2 midnights, meeting these requirements: - Younger than 38 - Lives within 1 hour of care - Has a competent adult at home to  recover with post-op recover - NO history of  - Chronic pain requiring opiods  - Diabetes  - Coronary Artery Disease  - Heart failure  - Heart attack  - Stroke  - DVT/VTE  - Cardiac arrhythmia  - Respiratory Failure/COPD  - Renal failure  - Anemia  - Advanced Liver disease

## 2022-05-26 NOTE — Anesthesia Procedure Notes (Signed)
Anesthesia Regional Block: Adductor canal block   Pre-Anesthetic Checklist: , timeout performed,  Correct Patient, Correct Site, Correct Laterality,  Correct Procedure, Correct Position, site marked,  Risks and benefits discussed,  Surgical consent,  Pre-op evaluation,  At surgeon's request and post-op pain management  Laterality: Right  Prep: chloraprep       Needles:  Injection technique: Single-shot  Needle Type: Stimulator Needle - 80     Needle Length: 10cm  Needle Gauge: 21     Additional Needles:   Narrative:  Start time: 05/26/2022 7:02 AM End time: 05/26/2022 7:12 AM Injection made incrementally with aspirations every 5 mL.  Performed by: Personally  Anesthesiologist: Duane Boston, MD

## 2022-05-26 NOTE — Transfer of Care (Signed)
Immediate Anesthesia Transfer of Care Note  Patient: Patricia Shelton  Procedure(s) Performed: RIGHT TOTAL KNEE ARTHROPLASTY (Right: Knee)  Patient Location: PACU  Anesthesia Type:Regional and Spinal  Level of Consciousness: awake, alert , oriented, and patient cooperative  Airway & Oxygen Therapy: Patient connected to nasal cannula oxygen  Post-op Assessment: Report given to RN and Post -op Vital signs reviewed and stable  Post vital signs: Reviewed and stable  Last Vitals:  Vitals Value Taken Time  BP 94/70 05/26/22 1030  Temp    Pulse 68 05/26/22 1032  Resp 18 05/26/22 1032  SpO2 86 % 05/26/22 1032  Vitals shown include unvalidated device data.  Last Pain:  Vitals:   05/26/22 0556  TempSrc:   PainSc: 2       Patients Stated Pain Goal: 0 ([id number])  Complications: There were no known notable events for this encounter.

## 2022-05-26 NOTE — Op Note (Signed)
NAME: Patricia Shelton, Patricia Shelton MEDICAL RECORD NO: [id number] ACCOUNT NO: [id number] DATE OF BIRTH: August 29, 1960 FACILITY: [facility] LOCATION: [facility] PHYSICIAN: Yetta Barre. Marlou Sa, MD  Operative Report   DATE OF PROCEDURE: 05/26/2022  PREOPERATIVE DIAGNOSES:  Right knee arthritis.  POSTOPERATIVE DIAGNOSES:  Right knee arthritis.  PROCEDURE:  Right total knee replacement using cemented components Persona cruciate retaining size 7 narrow femur, size E natural tibia cemented stemmed with 32 mm all poly cemented patella, 14 mm diameter tapered stem extension and 11 mm medial  congruent polyethylene insert.  SURGEON:  Yetta Barre. Marlou Sa, MD  ASSISTANT:  Annie Main, PA.  INDICATIONS:  The patient is a 62 year old patient with end-stage right knee arthritis, who presents for operative management after explanation of risks and benefits.  DESCRIPTION OF PROCEDURE:  The patient was brought to the operating room where spinal anesthetic was induced.  Preoperative antibiotics administered.  Timeout was called.  Right knee was examined under anesthesia.  Found to have good range of motion from  full extension to about 120 of flexion, but did have some varus deformity.  Right leg was prescrubbed with alcohol and Betadine, allowed to air dry, prepped with DuraPrep solution and draped in sterile manner.  Ioban used to cover the operative field.   Timeout was called and the leg was elevated and exsanguinated with the Esmarch wrap.  Tourniquet was inflated.  Anterior approach to the knee was made.  IrriSept solution utilized.  Median parapatellar approach was made and marked with #1 Vicryl suture.  IrriSept solution again utilized.  Fat pad partially excised.  Medial soft tissue dissection around the proximal tibial plateau was performed proportional to the patient's mild to moderate varus deformity.  Lateral patellofemoral ligament was released.   Soft tissue was removed from the anterior distal femur.  Patella was everted.   ACL released.  Anterior horn lateral meniscus released.  The patient had severe end-stage arthritis, both in the patellofemoral compartment as well as medial compartment of  the knee.  Early moderate arthritis was present in the lateral compartment.  The bone quality was good, but not quite good enough for press fit prosthesis.  Intramedullary alignment was then used to make a cut off the tibia perpendicular to the coronal  axis measuring 10 mm off the least affected lateral tibial plateau.  This was done with the collaterals and posterior neurovascular structures protected.  Next, the intramedullary alignment was used to make a cut on the femur in 5 degrees of valgus in 10  mm.  This gave very good alignment and stability with a 10 mm spacer in full extension.  Femur was sized to a size 7, narrow.  8 was too large in the medial and lateral plane.  The knee was cut in 3 degrees of external rotation, which gave symmetric  flexion and extension gaps.  About 1-2 mm of notching was performed, which really was unavoidable based on the patient's femoral sizing.  This was later tapered with the saw.  The anterior, posterior and chamfer cuts were made.  Trial components removed.   Tibia was then placed in rotational alignment in line with the medial third of the tibial tubercle. Screws were placed into position.  Femur was placed.  Good fit was obtained.  The patient was then trialed with the 10 and 11 mm spacer, which gave full  extension and good stability to varus and valgus stress at 0, 30 and 90 degrees, Patella was then cut down from 22 to 13 mm  and a 3-peg patellar button trial was placed.  With all trial components in position the patient had full extension, full flexion  as well as excellent stability to varus and valgus stress at 0, 30 and 90 degrees.  Patellar tracking was excellent with no thumbs technique.  Trial components removed after final preparations made on both the femur and the tibia with the  keel punch.   Thorough irrigation was then performed with 3 liters of pulsatile irrigation.  Bone plug was placed in the femur in the tibial side.  IrriSept solution utilized and then removed and then a bone plug was placed and we placed a vancomycin powder in the  intramedullary canal.  With the bone surfaces dry.  We cemented the components into position along with the 11 mm trial spacer. Excess cement was removed.  Thorough irrigation was performed.  The best stability was achieved with an 11 mm spacer, which  was placed.  The Tourniquet was released and bleeding points encountered controlled using electrocautery.  Thorough irrigation was then performed with a 4 liters of pouring irrigation.  Knee was then closed over bolster using #1 Vicryl suture.  Final to  prior closure, we irrigated the knee again with IrriSept solution and suctioned that out and then placed some vancomycin powder in the knee and then closed at that arthrotomy using #1 Vicryl suture.  Then, we injected the knee with Marcaine, morphine,  clonidine for postoperative pain relief. At this time, we closed the skin using interrupted inverted 0 Vicryl suture, 2-0 Vicryl suture, and 3-0 Monocryl with Steri-Strips.  Aquacel dressing, Ace wrap, iceman and knee immobilizer.  The patient tolerated  the procedure well without immediate complications, transferred to the recovery room in stable condition.  Luke's assistance was required at all times for retraction, opening, closing, mobilization of tissue.  His assistance was a medical necessity at  all times during the case.   PUS D: 05/26/2022 10:17:46 am T: 05/26/2022 11:07:00 am  JOB: [id number]

## 2022-05-26 NOTE — Anesthesia Postprocedure Evaluation (Signed)
Anesthesia Post Note  Patient: Patricia Shelton  Procedure(s) Performed: RIGHT TOTAL KNEE ARTHROPLASTY (Right: Knee)     Patient location during evaluation: PACU Anesthesia Type: MAC and Spinal Level of consciousness: awake and alert Pain management: pain level controlled Vital Signs Assessment: post-procedure vital signs reviewed and stable Respiratory status: spontaneous breathing and respiratory function stable Cardiovascular status: blood pressure returned to baseline and stable Postop Assessment: spinal receding Anesthetic complications: no  There were no known notable events for this encounter.  Last Vitals:  Vitals:   05/26/22 1130 05/26/22 1145  BP: 100/69 98/60  Pulse: 76 74  Resp: 17 18  Temp:    SpO2: 95% 95%    Last Pain:  Vitals:   05/26/22 1130  TempSrc:   PainSc: 1                  Patricia Shelton,Patricia Shelton

## 2022-05-26 NOTE — TOC Initial Note (Addendum)
Transition of Care [facility]) - Initial/Assessment Note    Patient Details  Name: Patricia Shelton MRN: [id number] Date of Birth: [birth year]  [facility]) CM/SW Contact:    Sharin Mons, RN Phone Number: 05/26/2022, 3:04 PM  Clinical Narrative:                      - S/p Right total knee replacement , 3/28 From home with family ( husband, daughters). Pt states family will assist with care once d/c. Pt without DME needs. States already with CPM, RW @ home. States husband has ordered BSC. Home health PT services prearranged with [facility] by provider. Pt without transportation or RX med needs.  PT evaluation pending....  TOC team following and will assist with needs.    Expected Discharge Plan: [place] Barriers to Discharge: Continued Medical Work up   Patient Goals and CMS Choice     Choice offered to / list presented to : Patient      Expected Discharge Plan and Services   Discharge Planning Services: CM Consult                               HH Arranged: PT [facility] Agency: [place] Date [place]: 05/26/22 Time HH Agency Contacted: [phone]    Prior Living Arrangements/Services   Lives with:: Spouse Patient language and need for interpreter reviewed:: Yes Do you feel safe going back to the place where you live?: Yes      Need for Family Participation in Patient Care: Yes (Comment) Care giver support system in place?: Yes (comment)   Criminal Activity/Legal Involvement Pertinent to Current Situation/Hospitalization: No - Comment as needed  Activities of Daily Living      Permission Sought/Granted                  Emotional Assessment Appearance:: Appears stated age Attitude/Demeanor/Rapport: Engaged Affect (typically observed): Accepting Orientation: : Oriented to Self, Oriented to Place, Oriented to  Time, Oriented to Situation Alcohol / Substance Use: Not Applicable Psych  Involvement: No (comment)  Admission diagnosis:  S/P TKR (total knee replacement), right [Z96.651] Patient Active Problem List   Diagnosis Date Noted   S/P TKR (total knee replacement), right 05/26/2022   Unilateral primary osteoarthritis, right knee 11/12/2021   Gastroesophageal reflux disease 03/17/2021   Chronic pain of right knee 12/13/2019   Tobacco abuse 01/18/2018   Chest pain 07/11/2016   Essential hypertension 07/11/2016   Diabetes mellitus type 2 in obese ([place]) 07/11/2016   PCP:  Shelda Pal, DO Pharmacy:   [facility] [address] [place] [address] Phone: [phone] Fax: [phone]     Social Determinants of Health (SDOH) Social History: SDOH Screenings   Depression (PHQ2-9): Low Risk  (04/15/2022)  Tobacco Use: Medium Risk (05/26/2022)   SDOH Interventions:     Readmission Risk Interventions     No data to display

## 2022-05-26 NOTE — Anesthesia Procedure Notes (Signed)
Spinal  Patient location during procedure: OR Start time: 05/26/2022 7:27 AM End time: 05/26/2022 7:35 AM Reason for block: surgical anesthesia Staffing Performed: anesthesiologist  Anesthesiologist: Duane Boston, MD Performed by: Duane Boston, MD Authorized by: Duane Boston, MD   Preanesthetic Checklist Completed: patient identified, IV checked, risks and benefits discussed, surgical consent, monitors and equipment checked, pre-op evaluation and timeout performed Spinal Block Patient position: sitting Prep: DuraPrep Patient monitoring: cardiac monitor, continuous pulse ox and blood pressure Approach: midline Location: L2-3 Injection technique: single-shot Needle Needle type: Pencan  Needle gauge: 24 G Needle length: 9 cm Assessment Events: CSF return Additional Notes Functioning IV was confirmed and monitors were applied. Sterile prep and drape, including hand hygiene and sterile gloves were used. The patient was positioned and the spine was prepped. The skin was anesthetized with lidocaine.  Free flow of clear CSF was obtained prior to injecting local anesthetic into the CSF.  The spinal needle aspirated freely following injection.  The needle was carefully withdrawn.  The patient tolerated the procedure well.

## 2022-05-26 NOTE — Evaluation (Signed)
Physical Therapy Evaluation Patient Details Name: Patricia Shelton MRN: [id number] DOB: 01-Oct-1960 Today's Date: 05/26/2022  History of Present Illness  Pt is 62 yo female presenting for scheduled R TKA on 05/26/22. PMH: GERD, chest pain, HTN, DM II, high cholesterol.  Clinical Impression  Pt presents with condition above and deficits mentioned below, see PT Problem List. PTA, she was independent without DME, working as a Materials engineer, and living with her husband and daughters in a 1-level house with 5 STE. Pt is moving very well s/p R TKA, only needing supervision for safety with transfers and ambulating around the unit while using a RW. She does report/demonstrate some mild instability/weakness in her R knee along with R knee flexion and extension ROM deficits though. Pt will likely progress well. Will continue to follow acutely.     Recommendations for follow up therapy are one component of a multi-disciplinary discharge planning process, led by the attending physician.  Recommendations may be updated based on patient status, additional functional criteria and insurance authorization.  Follow Up Recommendations       Assistance Recommended at Discharge PRN  Patient can return home with the following  Assistance with cooking/housework;Assist for transportation;Help with stairs or ramp for entrance    Equipment Recommendations Other (comment) (shower chair)  Recommendations for Other Services       Functional Status Assessment Patient has had a recent decline in their functional status and demonstrates the ability to make significant improvements in function in a reasonable and predictable amount of time.     Precautions / Restrictions Precautions Precautions: Fall (low) Restrictions Weight Bearing Restrictions: Yes RLE Weight Bearing: Weight bearing as tolerated      Mobility  Bed Mobility Overal bed mobility: Modified Independent             General bed mobility  comments: HOB elevated with pt exiting R side of bed, extra time to manage R leg but no assistance needed    Transfers Overall transfer level: Needs assistance Equipment used: Rolling walker (2 wheels) Transfers: Sit to/from Stand Sit to Stand: Supervision           General transfer comment: Supervision for safety but no LOB, cuing for hand placement. Extra time to rise the first rep, but improved speed and ease with 2nd rep from toilet.    Ambulation/Gait Ambulation/Gait assistance: Supervision Gait Distance (Feet): 800 Feet (x2 bouts of ~25 ft > ~800 ft) Assistive device: Rolling walker (2 wheels) Gait Pattern/deviations: Step-through pattern, Decreased dorsiflexion - right, Knee flexed in stance - right (decreased R knee flexion during swing) Gait velocity: reduced Gait velocity interpretation: >2.62 ft/sec, indicative of community ambulatory   General Gait Details: Pt ambulates at a steady pace with no LOB, scanning her surroundings. Pt with step-through gait pattern with slightly decreased R knee extension in stance phase and decreased R knee flexion with swing phase that impacts her R foot clearance mildly, but no foot drag. Supervision for safety  Stairs            Wheelchair Mobility    Modified Rankin (Stroke Patients Only)       Balance Overall balance assessment: Needs assistance Sitting-balance support: No upper extremity supported, Feet supported Sitting balance-Leahy Scale: Normal     Standing balance support: No upper extremity supported, Single extremity supported, Bilateral upper extremity supported, During functional activity Standing balance-Leahy Scale: Good Standing balance comment: Able to reach to lower legs to pull up underwear without UE support or LOB,  holds onto RW with 1 hand to loop underwear around foot in standing, relies on RW to ambulate                             Pertinent Vitals/Pain Pain Assessment Pain Assessment:  Faces Faces Pain Scale: Hurts a little bit Pain Location: R knee when in extension Pain Descriptors / Indicators: Discomfort, Grimacing, Guarding, Operative site guarding Pain Intervention(s): Limited activity within patient's tolerance, Monitored during session, Repositioned    Home Living Family/patient expects to be discharged to:: Private residence Living Arrangements: Spouse/significant other;Children (daughters) Available Help at Discharge: Family;Available 24 hours/day Type of Home: House Home Access: Stairs to enter Entrance Stairs-Rails: Psychiatric nurse of Steps: 5   Home Layout: One level Home Equipment: Conservation officer, nature (2 wheels);Crutches;Cane - single point      Prior Function Prior Level of Function : Independent/Modified Independent;Driving;Working/employed               ADLs Comments: Estate manager/land agent Dominance   Dominant Hand: Left    Extremity/Trunk Assessment   Upper Extremity Assessment Upper Extremity Assessment: Overall WFL for tasks assessed    Lower Extremity Assessment Lower Extremity Assessment: RLE deficits/detail RLE Deficits / Details: achieves ~80 degrees knee flexion with transfers; achieves ~0 degrees knee extension PROM; denies numbness/tingling; pt reports feeling a little weak and unsteady at the knee    Cervical / Trunk Assessment Cervical / Trunk Assessment: Normal  Communication   Communication: No difficulties  Cognition Arousal/Alertness: Awake/alert Behavior During Therapy: WFL for tasks assessed/performed Overall Cognitive Status: Within Functional Limits for tasks assessed                                          General Comments General comments (skin integrity, edema, etc.): encouraged elevation and ice and knee extension positioning at rest    Exercises     Assessment/Plan    PT Assessment Patient needs continued PT services  PT Problem List Decreased  strength;Decreased range of motion;Decreased activity tolerance;Decreased balance;Decreased mobility;Pain       PT Treatment Interventions DME instruction;Gait training;Stair training;Functional mobility training;Therapeutic activities;Therapeutic exercise;Balance training;Neuromuscular re-education;Patient/family education    PT Goals (Current goals can be found in the Care Plan section)  Acute Rehab PT Goals Patient Stated Goal: to walk PT Goal Formulation: With patient Time For Goal Achievement: 06/02/22 Potential to Achieve Goals: Good    Frequency BID     Co-evaluation               AM-PAC PT "6 Clicks" Mobility  Outcome Measure Help needed turning from your back to your side while in a flat bed without using bedrails?: None Help needed moving from lying on your back to sitting on the side of a flat bed without using bedrails?: None Help needed moving to and from a bed to a chair (including a wheelchair)?: A Little Help needed standing up from a chair using your arms (e.g., wheelchair or bedside chair)?: A Little Help needed to walk in hospital room?: A Little Help needed climbing 3-5 steps with a railing? : A Little 6 Click Score: 20    End of Session Equipment Utilized During Treatment: Gait belt Activity Tolerance: Patient tolerated treatment well Patient left: in chair;with call bell/phone within reach Nurse Communication: Mobility status;Other (comment) (discussed pt  with RN and confirmed family could ambulate pt later) PT Visit Diagnosis: Unsteadiness on feet (R26.81);Other abnormalities of gait and mobility (R26.89);Muscle weakness (generalized) (M62.81);Pain Pain - Right/Left: Right Pain - part of body: Knee    Time: [id number] PT Time Calculation (min) (ACUTE ONLY): 33 min   Charges:   PT Evaluation $PT Eval Low Complexity: 1 Low PT Treatments $Gait Training: 8-22 mins        Moishe Spice, PT, DPT Acute Rehabilitation Services  Office:  [phone]   Orvan Falconer 05/26/2022, 5:06 PM

## 2022-05-26 NOTE — Brief Op Note (Signed)
   05/26/2022  10:11 AM  PATIENT:  Patricia Shelton  62 y.o. female  PRE-OPERATIVE DIAGNOSIS:  right knee osteoarthritis  POST-OPERATIVE DIAGNOSIS:  right knee osteoarthritis  PROCEDURE:  Procedure(s): RIGHT TOTAL KNEE ARTHROPLASTY  SURGEON:  Surgeon(s): Meredith Pel, MD  ASSISTANT: magnant pa  ANESTHESIA:   spinal  EBL: 50 ml    Total I/O In: 1600 [I.V.:1500; IV Piggyback:100] Out: 25 [Blood:25]  BLOOD ADMINISTERED: none  DRAINS: none   LOCAL MEDICATIONS USED: Marcaine morphine clonidine Exparel vancomycin powder  SPECIMEN:  No Specimen  COUNTS:  YES  TOURNIQUET:   Total Tourniquet Time Documented: Thigh (Right) - 85 minutes Total: Thigh (Right) - 85 minutes   DICTATION: .Other Dictation: Dictation Number [id number]  PLAN OF CARE: Admit for overnight observation  PATIENT DISPOSITION:  PACU - hemodynamically stable

## 2022-05-27 ENCOUNTER — Other Ambulatory Visit (HOSPITAL_BASED_OUTPATIENT_CLINIC_OR_DEPARTMENT_OTHER): Payer: Self-pay

## 2022-05-27 DIAGNOSIS — M1711 Unilateral primary osteoarthritis, right knee: Secondary | ICD-10-CM | POA: Diagnosis not present

## 2022-05-27 LAB — GLUCOSE, CAPILLARY: Glucose-Capillary: 260 mg/dL — ABNORMAL HIGH (ref 70–99)

## 2022-05-27 MED ORDER — ASPIRIN 81 MG PO CHEW
81.0000 mg | CHEWABLE_TABLET | Freq: Two times a day (BID) | ORAL | 0 refills | Status: AC
  Filled 2022-05-27: qty 72, 36d supply, fill #0

## 2022-05-27 MED ORDER — DOCUSATE SODIUM 100 MG PO CAPS
100.0000 mg | ORAL_CAPSULE | Freq: Two times a day (BID) | ORAL | 0 refills | Status: DC
  Filled 2022-05-27: qty 100, 50d supply, fill #0

## 2022-05-27 MED ORDER — METHOCARBAMOL 500 MG PO TABS
500.0000 mg | ORAL_TABLET | Freq: Three times a day (TID) | ORAL | 0 refills | Status: DC | PRN
  Filled 2022-05-27: qty 30, 10d supply, fill #0

## 2022-05-27 MED ORDER — OXYCODONE HCL 5 MG PO TABS
5.0000 mg | ORAL_TABLET | ORAL | 0 refills | Status: DC | PRN
  Filled 2022-05-27: qty 30, 5d supply, fill #0

## 2022-05-27 NOTE — Progress Notes (Signed)
Physical Therapy Treatment Patient Details Name: Patricia Shelton MRN: [id number] DOB: 1960-10-27 Today's Date: 05/27/2022   History of Present Illness Pt is 62 yo female presenting for scheduled R TKA on 05/26/22. PMH: GERD, chest pain, HTN, DM II, high cholesterol.    PT Comments    Pt is progressing well towards goals. Pt was mod I for functional activities and supervision for stair navigation per home set up with single crutch. Due to pt current functional status, home set up and available assistance recommending HHPT on discharge from acute care hospital setting in order to improve ROM R knee (0 degrees extension, 85 degrees flexion), strength, mobility to return to age related activities and PLOF. Pt demonstrates no signs/symptoms of cardiac/respiratory distress throughout session.     Recommendations for follow up therapy are one component of a multi-disciplinary discharge planning process, led by the attending physician.  Recommendations may be updated based on patient status, additional functional criteria and insurance authorization.  Follow Up Recommendations       Assistance Recommended at Discharge PRN  Patient can return home with the following Assistance with cooking/housework;Assist for transportation;Help with stairs or ramp for entrance   Equipment Recommendations  Other (comment) (shower chair)    Recommendations for Other Services       Precautions / Restrictions Precautions Precautions: Fall Restrictions Weight Bearing Restrictions: Yes RLE Weight Bearing: Weight bearing as tolerated     Mobility  Bed Mobility     General bed mobility comments: Pt received and returned to recliner on pt request Patient Response: Cooperative  Transfers Overall transfer level: Modified independent Equipment used: Rolling walker (2 wheels) Transfers: Sit to/from Stand Sit to Stand: Modified independent (Device/Increase time)           General transfer comment: Pt does  well with good hand placement for safety.    Ambulation/Gait Ambulation/Gait assistance: Modified independent (Device/Increase time) Gait Distance (Feet): 550 Feet Assistive device: Rolling walker (2 wheels) Gait Pattern/deviations: Step-through pattern, Decreased dorsiflexion - right, Knee flexed in stance - right Gait velocity: Decreased cadence. Gait velocity interpretation: 1.31 - 2.62 ft/sec, indicative of limited community ambulator   General Gait Details: Pt demonstrates step through gait pattern with heel toe pattern but decreased DF on the R with knee flexed throughout gait.   Stairs Stairs: Yes Stairs assistance: Supervision Stair Management: Forwards, With crutches Number of Stairs: 4 General stair comments: pt requires verbal cues for correct sequencing but then is able to navigate well. Pt required cueing for correct side of the crutch in order to decrease strain on the knee.       Balance Overall balance assessment: Mild deficits observed, not formally tested Sitting-balance support: No upper extremity supported, Feet supported Sitting balance-Leahy Scale: Normal     Standing balance support: No upper extremity supported, Single extremity supported, Bilateral upper extremity supported, During functional activity Standing balance-Leahy Scale: Good Standing balance comment: No significant balance deficits.        Cognition Arousal/Alertness: Awake/alert Behavior During Therapy: WFL for tasks assessed/performed Overall Cognitive Status: Within Functional Limits for tasks assessed           General Comments General comments (skin integrity, edema, etc.): Pt was able to perform stairs today per home set up with a crutch which pt has available at home.      Pertinent Vitals/Pain Pain Assessment Pain Assessment: Faces Faces Pain Scale: Hurts little more Pain Location: R knee when in extension and WB Pain Descriptors / Indicators: Discomfort,  Grimacing,  Guarding, Operative site guarding Pain Intervention(s): Monitored during session, Limited activity within patient's tolerance     PT Goals (current goals can now be found in the care plan section) Acute Rehab PT Goals Patient Stated Goal: to walk PT Goal Formulation: With patient Time For Goal Achievement: 06/02/22 Potential to Achieve Goals: Good Progress towards PT goals: Progressing toward goals    Frequency    7X/week      PT Plan Current plan remains appropriate       AM-PAC PT "6 Clicks" Mobility   Outcome Measure  Help needed turning from your back to your side while in a flat bed without using bedrails?: None Help needed moving from lying on your back to sitting on the side of a flat bed without using bedrails?: None Help needed moving to and from a bed to a chair (including a wheelchair)?: None Help needed standing up from a chair using your arms (e.g., wheelchair or bedside chair)?: None Help needed to walk in hospital room?: None Help needed climbing 3-5 steps with a railing? : A Little 6 Click Score: 23    End of Session Equipment Utilized During Treatment: Gait belt Activity Tolerance: Patient tolerated treatment well Patient left: in chair;with call bell/phone within reach Nurse Communication: Mobility status PT Visit Diagnosis: Unsteadiness on feet (R26.81);Other abnormalities of gait and mobility (R26.89);Muscle weakness (generalized) (M62.81);Pain Pain - Right/Left: Right Pain - part of body: Knee     Time: 0910-0923 PT Time Calculation (min) (ACUTE ONLY): 13 min  Charges:  $Gait Training: 8-22 mins                    Tomma Rakers, DPT, [place] Office: [phone] (Secure chat preferred)    Ander Purpura 05/27/2022, 11:11 AM

## 2022-05-27 NOTE — Progress Notes (Signed)
Patient awaiting ride.  Denied CBG checks.

## 2022-05-30 ENCOUNTER — Other Ambulatory Visit (HOSPITAL_BASED_OUTPATIENT_CLINIC_OR_DEPARTMENT_OTHER): Payer: Self-pay

## 2022-05-30 ENCOUNTER — Encounter: Payer: Self-pay | Admitting: Family Medicine

## 2022-05-30 DIAGNOSIS — I10 Essential (primary) hypertension: Secondary | ICD-10-CM

## 2022-05-30 MED ORDER — LISINOPRIL 40 MG PO TABS
40.0000 mg | ORAL_TABLET | Freq: Every day | ORAL | 1 refills | Status: AC
Start: 2022-05-30 — End: ?
  Filled 2022-05-30 – 2022-06-13 (×2): qty 90, 90d supply, fill #0
  Filled 2022-09-06: qty 90, 90d supply, fill #1

## 2022-06-01 ENCOUNTER — Other Ambulatory Visit: Payer: Self-pay

## 2022-06-01 ENCOUNTER — Other Ambulatory Visit (HOSPITAL_BASED_OUTPATIENT_CLINIC_OR_DEPARTMENT_OTHER): Payer: Self-pay

## 2022-06-06 ENCOUNTER — Other Ambulatory Visit: Payer: Self-pay | Admitting: Family Medicine

## 2022-06-06 ENCOUNTER — Other Ambulatory Visit (HOSPITAL_BASED_OUTPATIENT_CLINIC_OR_DEPARTMENT_OTHER): Payer: Self-pay

## 2022-06-06 ENCOUNTER — Other Ambulatory Visit: Payer: Self-pay

## 2022-06-06 DIAGNOSIS — I10 Essential (primary) hypertension: Secondary | ICD-10-CM

## 2022-06-06 MED ORDER — AMLODIPINE BESYLATE 5 MG PO TABS
5.0000 mg | ORAL_TABLET | Freq: Every day | ORAL | 0 refills | Status: DC
Start: 2022-06-06 — End: 2022-09-06
  Filled 2022-06-06: qty 90, 90d supply, fill #0

## 2022-06-07 ENCOUNTER — Telehealth: Payer: Self-pay | Admitting: Surgical

## 2022-06-07 ENCOUNTER — Encounter: Payer: Self-pay | Admitting: Family Medicine

## 2022-06-07 ENCOUNTER — Other Ambulatory Visit (HOSPITAL_BASED_OUTPATIENT_CLINIC_OR_DEPARTMENT_OTHER): Payer: Self-pay

## 2022-06-07 MED ORDER — BD PEN NEEDLE NANO 2ND GEN 32G X 4 MM MISC
2 refills | Status: DC
  Filled 2022-06-07: qty 100, 100d supply, fill #0
  Filled 2022-06-13: qty 100, 25d supply, fill #0
  Filled 2022-06-13: qty 100, 90d supply, fill #0
  Filled 2022-09-06: qty 100, 90d supply, fill #1

## 2022-06-07 NOTE — Telephone Encounter (Signed)
Pt called requesting a refill of oxycodone. Please send to [facility] on [address]. Pt phone number is [phone].

## 2022-06-08 ENCOUNTER — Other Ambulatory Visit: Payer: Self-pay | Admitting: Surgical

## 2022-06-08 ENCOUNTER — Other Ambulatory Visit (HOSPITAL_BASED_OUTPATIENT_CLINIC_OR_DEPARTMENT_OTHER): Payer: Self-pay

## 2022-06-08 MED ORDER — OXYCODONE HCL 5 MG PO TABS
5.0000 mg | ORAL_TABLET | Freq: Four times a day (QID) | ORAL | 0 refills | Status: DC | PRN
  Filled 2022-06-08: qty 30, 7d supply, fill #0

## 2022-06-08 MED ORDER — OXYCODONE HCL 5 MG PO TABS
5.0000 mg | ORAL_TABLET | Freq: Four times a day (QID) | ORAL | 0 refills | Status: DC | PRN
  Filled 2022-06-08: qty 30, 8d supply, fill #0

## 2022-06-08 NOTE — Telephone Encounter (Signed)
Called and advised pt but she wants it sent to the [facility] [place], please resend

## 2022-06-08 NOTE — Telephone Encounter (Signed)
Sent in

## 2022-06-08 NOTE — Telephone Encounter (Signed)
Sent there instead

## 2022-06-09 ENCOUNTER — Other Ambulatory Visit (INDEPENDENT_AMBULATORY_CARE_PROVIDER_SITE_OTHER): Payer: 59

## 2022-06-09 ENCOUNTER — Other Ambulatory Visit (HOSPITAL_BASED_OUTPATIENT_CLINIC_OR_DEPARTMENT_OTHER): Payer: Self-pay

## 2022-06-09 ENCOUNTER — Ambulatory Visit (INDEPENDENT_AMBULATORY_CARE_PROVIDER_SITE_OTHER): Payer: BC Managed Care – PPO | Admitting: Surgical

## 2022-06-09 ENCOUNTER — Encounter: Payer: Self-pay | Admitting: Surgical

## 2022-06-09 DIAGNOSIS — M1711 Unilateral primary osteoarthritis, right knee: Secondary | ICD-10-CM

## 2022-06-09 NOTE — Progress Notes (Signed)
Post-Op Visit Note   Patient: Patricia Shelton           Date of Birth: 11/27/1960           MRN: [id number] Visit Date: 06/09/2022 PCP: Sharlene Dory, DO   Assessment & Plan:  Chief Complaint:  Chief Complaint  Patient presents with   Right Knee - Routine Post Op    R TKA (surgery date 05-26-22)   Visit Diagnoses:  1. Unilateral primary osteoarthritis, right knee     Plan: Patricia Shelton is a 62 y.o. female who presents s/p right total knee arthroplasty on 05/26/2022.  Doing well overall. They deny any calf pain, shortness of breath, chest pain, abdominal pain.  Pain is overall controlled and taking oxycodone sparingly for pain control.  Taking aspirin for DVT prophylaxis.  Ambulating with single crutch.  She is doing home health physical therapy and this is progressing well.  She is up to 107 degrees with physical therapy..   On exam, patient has range of motion 3 degrees extension to 105 degrees of knee flexion.  Incision is healing well without evidence of infection or dehiscence.  2+ DP pulse of the operative extremity.  No calf tenderness, negative Homans' sign.  Able to perform straight leg raise.  Intact ankle dorsiflexion.  Plan is continue with physical therapy to start outpatient physical therapy in [place].  Needs to focus a little bit on achieving full extension and this was demonstrated for her in the office.  She has done a great job of achieving excellent knee flexion just 2 weeks out from surgery.  Overall she is very pleased with how her knee pain feels and she states that her postoperative knee pain is really just rated about a 2/10 compared with her preoperative knee pain which was at least 6/10.  Follow-up with Dr. August Saucer in 4 weeks..    Follow-Up Instructions: No follow-ups on file.   Orders:  Orders Placed This Encounter  Procedures   XR Knee 1-2 Views Right   No orders of the defined types were placed in this encounter.   Imaging: No results  found.  PMFS History: Patient Active Problem List   Diagnosis Date Noted   S/P TKR (total knee replacement), right 05/26/2022   Arthritis of right knee 11/12/2021   Gastroesophageal reflux disease 03/17/2021   Chronic pain of right knee 12/13/2019   Tobacco abuse 01/18/2018   Chest pain 07/11/2016   Essential hypertension 07/11/2016   Diabetes mellitus type 2 in obese 07/11/2016   Past Medical History:  Diagnosis Date   Arthritis    Diabetes mellitus without complication (HCC)    Type 2   GERD (gastroesophageal reflux disease)    High cholesterol    History of chicken pox    History of hiatal hernia    Hypertension    Obesity    Tobacco abuse     Family History  Problem Relation Age of Onset   Other Mother        valvular heart disease from Rheumatic heart disease - s/p bioprosthetic mvr/avr   Cancer Mother        Breast   CAD Maternal Grandmother        CABG   Diabetes Maternal Grandfather     Past Surgical History:  Procedure Laterality Date   ABDOMINAL HYSTERECTOMY  2009   APPENDECTOMY  2006   CHOLECYSTECTOMY  2006   COLONOSCOPY  2021   Removed fatty tumor from back  TOTAL KNEE ARTHROPLASTY Right 05/26/2022   Procedure: RIGHT TOTAL KNEE ARTHROPLASTY;  Surgeon: Cammy Copa, MD;  Location: [facility] OR;  Service: Orthopedics;  Laterality: Right;   Social History   Occupational History   Not on file  Tobacco Use   Smoking status: Former    Packs/day: 1.00    Years: 40.00    Additional pack years: 0.00    Total pack years: 40.00    Types: Cigarettes    Quit date: 06/2021    Years since quitting: 0.9   Smokeless tobacco: Never   Tobacco comments:    smoked 1.5 ppd for ~ 40 yrs - recently cut down to 1/2 ppd.  Vaping Use   Vaping Use: Never used  Substance and Sexual Activity   Alcohol use: Yes    Comment: occ   Drug use: No   Sexual activity: Yes    Partners: Male

## 2022-06-10 ENCOUNTER — Encounter: Payer: BC Managed Care – PPO | Admitting: Surgical

## 2022-06-12 NOTE — Discharge Summary (Signed)
Physician Discharge Summary      Patient ID: Patricia Shelton MRN: [id number] DOB/AGE: 11-09-1960 62 y.o.  Admit date: 05/26/2022 Discharge date: 05/27/2022  Admission Diagnoses:  Principal Problem:   S/P TKR (total knee replacement), right Active Problems:   Arthritis of right knee   Discharge Diagnoses:  Same  Surgeries: Procedure(s): RIGHT TOTAL KNEE ARTHROPLASTY on 05/26/2022   Consultants:   Discharged Condition: Stable  Hospital Course: AVALEY COOP is an 62 y.o. female who was admitted 05/26/2022 with a chief complaint of right knee pain, and found to have a diagnosis of right knee osteoarthritis.  They were brought to the operating room on 05/26/2022 and underwent the above named procedures.  Pt awoke from anesthesia without complication and was transferred to the floor. On POD1, patient's pain was controlled.  No red flag symptoms.  She would mobilize well with physical therapy.  She was discharged home on POD 1..  Pt will f/u with Dr. August Saucer in clinic in ~2 weeks.   Antibiotics given:  Anti-infectives (From admission, onward)    Start     Dose/Rate Route Frequency Ordered Stop   05/26/22 1500  ceFAZolin (ANCEF) IVPB 2g/100 mL premix        2 g 200 mL/hr over 30 Minutes Intravenous Every 8 hours 05/26/22 1351 05/26/22 2218   05/26/22 0804  vancomycin (VANCOCIN) powder  Status:  Discontinued          As needed 05/26/22 0804 05/26/22 1025   05/26/22 0600  ceFAZolin (ANCEF) IVPB 2g/100 mL premix        2 g 200 mL/hr over 30 Minutes Intravenous On call to O.R. 05/26/22 7829 05/26/22 0736     .  Recent vital signs:  Vitals:   05/27/22 0422 05/27/22 0840  BP: 113/70 (!) 98/57  Pulse: 65 80  Resp: 20 17  Temp:  97.7 F (36.5 C)  SpO2: 90% 95%    Recent laboratory studies:  Results for orders placed or performed during the hospital encounter of 05/26/22  Glucose, capillary  Result Value Ref Range   Glucose-Capillary 198 (H) 70 - 99 mg/dL  Glucose, capillary   Result Value Ref Range   Glucose-Capillary 153 (H) 70 - 99 mg/dL  Glucose, capillary  Result Value Ref Range   Glucose-Capillary 184 (H) 70 - 99 mg/dL  Glucose, capillary  Result Value Ref Range   Glucose-Capillary 208 (H) 70 - 99 mg/dL  Glucose, capillary  Result Value Ref Range   Glucose-Capillary 281 (H) 70 - 99 mg/dL  Glucose, capillary  Result Value Ref Range   Glucose-Capillary 305 (H) 70 - 99 mg/dL  Glucose, capillary  Result Value Ref Range   Glucose-Capillary 260 (H) 70 - 99 mg/dL    Discharge Medications:   Allergies as of 05/27/2022       Reactions   Sulfa Antibiotics Nausea And Vomiting        Medication List     STOP taking these medications    nitrofurantoin (macrocrystal-monohydrate) 100 MG capsule Commonly known as: MACROBID   traMADol 50 MG tablet Commonly known as: ULTRAM       TAKE these medications    Accu-Chek Aviva Soln Use for glucometer calibration   Accu-Chek Guide Me w/Device Kit Use to check blood sugar twice a day. DX   Accu-Chek Guide test strip Generic drug: glucose blood Use as instructed to check blood sugar twice a day.   DX  E11.9   Accu-Chek Softclix Lancet Dev Kit Check  blood sugars twice daily   albuterol 108 (90 Base) MCG/ACT inhaler Commonly known as: VENTOLIN HFA INHALE 2 PUFFS INTO THE LUNGS EVERY 6 HOURS AS NEEDED FOR WHEEZING OR SHORTNESS OF BREATH   aspirin 81 MG chewable tablet Chew 1 tablet (81 mg total) by mouth 2 (two) times daily. What changed: when to take this   buPROPion 150 MG 12 hr tablet Commonly known as: ZYBAN 1 week before quit date, take 1 tab daily for 3 days and then 1 tab twice daily.   docusate sodium 100 MG capsule Commonly known as: COLACE Take 1 capsule (100 mg total) by mouth 2 (two) times daily.   hydrochlorothiazide 25 MG tablet Commonly known as: HYDRODIURIL TAKE 1 TABLET(25 MG) BY MOUTH DAILY   levocetirizine 5 MG tablet Commonly known as: XYZAL Take 1 tablet (5 mg  total) by mouth every evening.   meloxicam 15 MG tablet Commonly known as: MOBIC Take 1 tablet (15 mg total) by mouth daily.   metFORMIN 500 MG 24 hr tablet Commonly known as: GLUCOPHAGE-XR Take 2 tablets (1,000 mg total) by mouth 2 (two) times daily with a meal.   methocarbamol 500 MG tablet Commonly known as: ROBAXIN Take 1 tablet (500 mg total) by mouth every 8 (eight) hours as needed for muscle spasms.   pantoprazole 40 MG tablet Commonly known as: PROTONIX Take 1 tablet (40 mg total) by mouth daily.   pravastatin 40 MG tablet Commonly known as: PRAVACHOL Take 1 tablet (40 mg total) by mouth daily.   Toujeo SoloStar 300 UNIT/ML Solostar Pen Generic drug: insulin glargine (1 Unit Dial) Inject 18 Units into the skin at bedtime.   Trulicity 3 MG/0.5ML Sopn Generic drug: Dulaglutide Inject 3 mg into the skin once a week as directed.        Diagnostic Studies: XR Knee 1-2 Views Right  Result Date: 06/09/2022 AP and lateral views of right knee reviewed.  Right total knee prosthesis in good position and alignment without any complicating features.   Disposition: Discharge disposition: 01-Home or Self Care       Discharge Instructions     Call MD / Call 911   Complete by: As directed    If you experience chest pain or shortness of breath, CALL 911 and be transported to the hospital emergency room.  If you develope a fever above 101 F, pus (white drainage) or increased drainage or redness at the wound, or calf pain, call your surgeon's office.   Constipation Prevention   Complete by: As directed    Drink plenty of fluids.  Prune juice may be helpful.  You may use a stool softener, such as Colace (over the counter) 100 mg twice a day.  Use MiraLax (over the counter) for constipation as needed.   Diet - low sodium heart healthy   Complete by: As directed    Discharge instructions   Complete by: As directed    You may shower, dressing is waterproof.  Do not remove the  dressing, we will remove it at your first post-op appointment.  Do not take a bath or soak the knee in a tub or pool.  You may weightbear as you can tolerate on the operative leg with a walker.  Continue using the CPM machine 3 times per day for one hour each time, increasing the degrees of range of motion daily.  Use the blue cradle boot under your heel to work on getting your leg straight.  Do NOT put a  pillow under your knee.  You will follow-up with Dr. August Saucer in the clinic in 2 weeks at your given appointment date.    INSTRUCTIONS AFTER JOINT REPLACEMENT   Remove items at home which could result in a fall. This includes throw rugs or furniture in walking pathways ICE to the affected joint every three hours while awake for 30 minutes at a time, for at least the first 3-5 days, and then as needed for pain and swelling.  Continue to use ice for pain and swelling. You may notice swelling that will progress down to the foot and ankle.  This is normal after surgery.  Elevate your leg when you are not up walking on it.   Continue to use the breathing machine you got in the hospital (incentive spirometer) which will help keep your temperature down.  It is common for your temperature to cycle up and down following surgery, especially at night when you are not up moving around and exerting yourself.  The breathing machine keeps your lungs expanded and your temperature down.   DIET:  As you were doing prior to hospitalization, we recommend a well-balanced diet.  DRESSING / WOUND CARE / SHOWERING  Keep the surgical dressing until follow up.  The dressing is water proof, so you can shower without any extra covering.  IF THE DRESSING FALLS OFF or the wound gets wet inside, change the dressing with sterile gauze.  Please use good hand washing techniques before changing the dressing.  Do not use any lotions or creams on the incision until instructed by your surgeon.    ACTIVITY  Increase activity slowly as  tolerated, but follow the weight bearing instructions below.   No driving for 6 weeks or until further direction given by your physician.  You cannot drive while taking narcotics.  No lifting or carrying greater than 10 lbs. until further directed by your surgeon. Avoid periods of inactivity such as sitting longer than an hour when not asleep. This helps prevent blood clots.  You may return to work once you are authorized by your doctor.     WEIGHT BEARING   Weight bearing as tolerated with assist device (walker, cane, etc) as directed, use it as long as suggested by your surgeon or therapist, typically at least 4-6 weeks.   EXERCISES  Results after joint replacement surgery are often greatly improved when you follow the exercise, range of motion and muscle strengthening exercises prescribed by your doctor. Safety measures are also important to protect the joint from further injury. Any time any of these exercises cause you to have increased pain or swelling, decrease what you are doing until you are comfortable again and then slowly increase them. If you have problems or questions, call your caregiver or physical therapist for advice.   Rehabilitation is important following a joint replacement. After just a few days of immobilization, the muscles of the leg can become weakened and shrink (atrophy).  These exercises are designed to build up the tone and strength of the thigh and leg muscles and to improve motion. Often times heat used for twenty to thirty minutes before working out will loosen up your tissues and help with improving the range of motion but do not use heat for the first two weeks following surgery (sometimes heat can increase post-operative swelling).   These exercises can be done on a training (exercise) mat, on the floor, on a table or on a bed. Use whatever works the best and is  most comfortable for you.    Use music or television while you are exercising so that the exercises  are a pleasant break in your day. This will make your life better with the exercises acting as a break in your routine that you can look forward to.   Perform all exercises about fifteen times, three times per day or as directed.  You should exercise both the operative leg and the other leg as well.  Exercises include:   Quad Sets - Tighten up the muscle on the front of the thigh (Quad) and hold for 5-10 seconds.   Straight Leg Raises - With your knee straight (if you were given a brace, keep it on), lift the leg to 60 degrees, hold for 3 seconds, and slowly lower the leg.  Perform this exercise against resistance later as your leg gets stronger.  Leg Slides: Lying on your back, slowly slide your foot toward your buttocks, bending your knee up off the floor (only go as far as is comfortable). Then slowly slide your foot back down until your leg is flat on the floor again.  Angel Wings: Lying on your back spread your legs to the side as far apart as you can without causing discomfort.  Hamstring Strength:  Lying on your back, push your heel against the floor with your leg straight by tightening up the muscles of your buttocks.  Repeat, but this time bend your knee to a comfortable angle, and push your heel against the floor.  You may put a pillow under the heel to make it more comfortable if necessary.   A rehabilitation program following joint replacement surgery can speed recovery and prevent re-injury in the future due to weakened muscles. Contact your doctor or a physical therapist for more information on knee rehabilitation.    CONSTIPATION  Constipation is defined medically as fewer than three stools per week and severe constipation as less than one stool per week.  Even if you have a regular bowel pattern at home, your normal regimen is likely to be disrupted due to multiple reasons following surgery.  Combination of anesthesia, postoperative narcotics, change in appetite and fluid intake all  can affect your bowels.   YOU MUST use at least one of the following options; they are listed in order of increasing strength to get the job done.  They are all available over the counter, and you may need to use some, POSSIBLY even all of these options:    Drink plenty of fluids (prune juice may be helpful) and high fiber foods Colace 100 mg by mouth twice a day  Senokot for constipation as directed and as needed Dulcolax (bisacodyl), take with full glass of water  Miralax (polyethylene glycol) once or twice a day as needed.  If you have tried all these things and are unable to have a bowel movement in the first 3-4 days after surgery call either your surgeon or your primary doctor.    If you experience loose stools or diarrhea, hold the medications until you stool forms back up.  If your symptoms do not get better within 1 week or if they get worse, check with your doctor.  If you experience "the worst abdominal pain ever" or develop nausea or vomiting, please contact the office immediately for further recommendations for treatment.   ITCHING:  If you experience itching with your medications, try taking only a single pain pill, or even half a pain pill at a time.  You  can also use Benadryl over the counter for itching or also to help with sleep.   TED HOSE STOCKINGS:  Use stockings on both legs until for at least 2 weeks or as directed by physician office. They may be removed at night for sleeping.  MEDICATIONS:  See your medication summary on the "After Visit Summary" that nursing will review with you.  You may have some home medications which will be placed on hold until you complete the course of blood thinner medication.  It is important for you to complete the blood thinner medication as prescribed.  PRECAUTIONS:  If you experience chest pain or shortness of breath - call 911 immediately for transfer to the hospital emergency department.   If you develop a fever greater that 101 F,  purulent drainage from wound, increased redness or drainage from wound, foul odor from the wound/dressing, or calf pain - CONTACT YOUR SURGEON.                                                   FOLLOW-UP APPOINTMENTS:  If you do not already have a post-op appointment, please call the office for an appointment to be seen by your surgeon.  Guidelines for how soon to be seen are listed in your "After Visit Summary", but are typically between 1-4 weeks after surgery.  OTHER INSTRUCTIONS:   Knee Replacement:  Do not place pillow under knee, focus on keeping the knee straight while resting. CPM instructions: 0-90 degrees, 2 hours in the morning, 2 hours in the afternoon, and 2 hours in the evening. Place foam block, curve side up under heel at all times except when in CPM or when walking.  DO NOT modify, tear, cut, or change the foam block in any way.  POST-OPERATIVE OPIOID TAPER INSTRUCTIONS: It is important to wean off of your opioid medication as soon as possible. If you do not need pain medication after your surgery it is ok to stop day one. Opioids include: Codeine, Hydrocodone(Norco, Vicodin), Oxycodone(Percocet, oxycontin) and hydromorphone amongst others.  Long term and even short term use of opiods can cause: Increased pain response Dependence Constipation Depression Respiratory depression And more.  Withdrawal symptoms can include Flu like symptoms Nausea, vomiting And more Techniques to manage these symptoms Hydrate well Eat regular healthy meals Stay active Use relaxation techniques(deep breathing, meditating, yoga) Do Not substitute Alcohol to help with tapering If you have been on opioids for less than two weeks and do not have pain than it is ok to stop all together.  Plan to wean off of opioids This plan should start within one week post op of your joint replacement. Maintain the same interval or time between taking each dose and first decrease the dose.  Cut the total  daily intake of opioids by one tablet each day Next start to increase the time between doses. The last dose that should be eliminated is the evening dose.   MAKE SURE YOU:  Understand these instructions.  Get help right away if you are not doing well or get worse.    Thank you for letting us be a part of your medical care team.  It is a privilege we respect greatly.  We hope these instructions will help you stay on track for a fast and full recovery!    Dental Antibiotics:  In  most cases prophylactic antibiotics for Dental procdeures after total joint surgery are not necessary.  Exceptions are as follows:  1. History of prior total joint infection  2. Severely immunocompromised (Organ Transplant, cancer chemotherapy, Rheumatoid biologic meds such as Humera)  3. Poorly controlled diabetes (A1C &gt; 8.0, blood glucose over 200)  If you have one of these conditions, contact your surgeon for an antibiotic prescription, prior to your dental procedure.   Increase activity slowly as tolerated   Complete by: As directed    Post-operative opioid taper instructions:   Complete by: As directed    POST-OPERATIVE OPIOID TAPER INSTRUCTIONS: It is important to wean off of your opioid medication as soon as possible. If you do not need pain medication after your surgery it is ok to stop day one. Opioids include: Codeine, Hydrocodone(Norco, Vicodin), Oxycodone(Percocet, oxycontin) and hydromorphone amongst others.  Long term and even short term use of opiods can cause: Increased pain response Dependence Constipation Depression Respiratory depression And more.  Withdrawal symptoms can include Flu like symptoms Nausea, vomiting And more Techniques to manage these symptoms Hydrate well Eat regular healthy meals Stay active Use relaxation techniques(deep breathing, meditating, yoga) Do Not substitute Alcohol to help with tapering If you have been on opioids for less than two weeks and  do not have pain than it is ok to stop all together.  Plan to wean off of opioids This plan should start within one week post op of your joint replacement. Maintain the same interval or time between taking each dose and first decrease the dose.  Cut the total daily intake of opioids by one tablet each day Next start to increase the time between doses. The last dose that should be eliminated is the evening dose.           Follow-up Information     Sharlene Dory, DO Follow up.   Specialty: Family Medicine Contact information: [address] [place] [address] [phone]         [facility] Follow up.   Specialty: Home Health Services Why: home health services will be provided by [facility] , start of care within 48 hurs post discharge Contact information: [address] [place] [address] [phone]                  Signed: Karenann Cai 06/12/2022, 4:07 PM

## 2022-06-13 ENCOUNTER — Other Ambulatory Visit (HOSPITAL_BASED_OUTPATIENT_CLINIC_OR_DEPARTMENT_OTHER): Payer: Self-pay

## 2022-06-13 ENCOUNTER — Encounter: Payer: Self-pay | Admitting: Family Medicine

## 2022-06-14 ENCOUNTER — Encounter: Payer: Self-pay | Admitting: Family Medicine

## 2022-06-14 ENCOUNTER — Telehealth (INDEPENDENT_AMBULATORY_CARE_PROVIDER_SITE_OTHER): Payer: 59 | Admitting: Family Medicine

## 2022-06-14 DIAGNOSIS — R300 Dysuria: Secondary | ICD-10-CM | POA: Diagnosis not present

## 2022-06-14 MED ORDER — CEPHALEXIN 500 MG PO CAPS
500.0000 mg | ORAL_CAPSULE | Freq: Three times a day (TID) | ORAL | 0 refills | Status: AC
Start: 2022-06-14 — End: 2022-06-21

## 2022-06-14 NOTE — Progress Notes (Signed)
Chief Complaint  Patient presents with   Urinary Tract Infection    Patricia Shelton is a 62 y.o. female here for possible UTI. We are interacting via web portal for an electronic face-to-face visit. I verified patient's ID using 2 identifiers. Patient agreed to proceed with visit via this method. Patient is at home, I am at office. Patient and I are present for visit.   Duration: 2 days. Symptoms: Dysuria, urinary frequency, urinary hesitancy, urinary retention, flank pain on bilateral, and urgency Denies: hematuria, fever, nausea, vomiting, and urinary incontinence, vaginal discharge Hx of recurrent UTI? No Denies new sexual partners.  Past Medical History:  Diagnosis Date   Arthritis    Diabetes mellitus without complication    Type 2   GERD (gastroesophageal reflux disease)    High cholesterol    History of chicken pox    History of hiatal hernia    Hypertension    Obesity    Tobacco abuse     Objective No conversational dyspnea Age appropriate judgment and insight Nml affect and mood  Dysuria - Plan: cephALEXin (KEFLEX) 500 MG capsule  7 d of Keflex. Stay hydrated. Seek immediate care if pt starts to develop fevers, new/worsening symptoms, uncontrollable N/V. F/u prn. The patient voiced understanding and agreement to the plan.  Jilda Roche No Name, DO 06/14/22 2:29 PM

## 2022-07-01 ENCOUNTER — Other Ambulatory Visit: Payer: Self-pay | Admitting: Surgical

## 2022-07-01 ENCOUNTER — Other Ambulatory Visit (HOSPITAL_BASED_OUTPATIENT_CLINIC_OR_DEPARTMENT_OTHER): Payer: Self-pay

## 2022-07-01 ENCOUNTER — Other Ambulatory Visit: Payer: Self-pay | Admitting: Family Medicine

## 2022-07-01 MED ORDER — MELOXICAM 15 MG PO TABS
15.0000 mg | ORAL_TABLET | Freq: Every day | ORAL | 2 refills | Status: DC
  Filled 2022-07-01: qty 30, 30d supply, fill #0
  Filled 2022-08-01: qty 30, 30d supply, fill #1
  Filled 2022-08-28: qty 30, 30d supply, fill #2

## 2022-07-01 MED ORDER — OXYCODONE HCL 5 MG PO TABS
5.0000 mg | ORAL_TABLET | Freq: Two times a day (BID) | ORAL | 0 refills | Status: DC | PRN
  Filled 2022-07-01: qty 30, 15d supply, fill #0

## 2022-07-04 ENCOUNTER — Other Ambulatory Visit (HOSPITAL_BASED_OUTPATIENT_CLINIC_OR_DEPARTMENT_OTHER): Payer: Self-pay

## 2022-07-04 ENCOUNTER — Other Ambulatory Visit: Payer: Self-pay | Admitting: Family Medicine

## 2022-07-04 ENCOUNTER — Other Ambulatory Visit: Payer: Self-pay

## 2022-07-04 DIAGNOSIS — E669 Obesity, unspecified: Secondary | ICD-10-CM

## 2022-07-06 ENCOUNTER — Ambulatory Visit (INDEPENDENT_AMBULATORY_CARE_PROVIDER_SITE_OTHER): Payer: BC Managed Care – PPO | Admitting: Surgical

## 2022-07-06 ENCOUNTER — Other Ambulatory Visit (HOSPITAL_BASED_OUTPATIENT_CLINIC_OR_DEPARTMENT_OTHER): Payer: Self-pay

## 2022-07-06 ENCOUNTER — Other Ambulatory Visit (INDEPENDENT_AMBULATORY_CARE_PROVIDER_SITE_OTHER): Payer: 59

## 2022-07-06 ENCOUNTER — Other Ambulatory Visit: Payer: Self-pay | Admitting: Family Medicine

## 2022-07-06 DIAGNOSIS — Z96651 Presence of right artificial knee joint: Secondary | ICD-10-CM

## 2022-07-06 DIAGNOSIS — E669 Obesity, unspecified: Secondary | ICD-10-CM

## 2022-07-07 ENCOUNTER — Other Ambulatory Visit: Payer: Self-pay | Admitting: Family Medicine

## 2022-07-07 DIAGNOSIS — E1169 Type 2 diabetes mellitus with other specified complication: Secondary | ICD-10-CM

## 2022-07-08 ENCOUNTER — Other Ambulatory Visit: Payer: Self-pay | Admitting: Family Medicine

## 2022-07-08 ENCOUNTER — Other Ambulatory Visit (HOSPITAL_BASED_OUTPATIENT_CLINIC_OR_DEPARTMENT_OTHER): Payer: Self-pay

## 2022-07-08 DIAGNOSIS — E119 Type 2 diabetes mellitus without complications: Secondary | ICD-10-CM

## 2022-07-08 DIAGNOSIS — E669 Obesity, unspecified: Secondary | ICD-10-CM

## 2022-07-08 MED ORDER — TRULICITY 3 MG/0.5ML ~~LOC~~ SOAJ
3.0000 mg | SUBCUTANEOUS | 2 refills | Status: DC
Start: 2022-07-08 — End: 2022-09-29
  Filled 2022-07-08: qty 2, 28d supply, fill #0
  Filled 2022-08-01: qty 2, 28d supply, fill #1
  Filled 2022-08-28: qty 2, 28d supply, fill #2

## 2022-07-09 ENCOUNTER — Encounter: Payer: Self-pay | Admitting: Surgical

## 2022-07-09 NOTE — Progress Notes (Signed)
Post-Op Visit Note   Patient: Patricia Shelton           Date of Birth: 1960/03/12           MRN: [id number] Visit Date: 07/06/2022 PCP: Sharlene Dory, DO   Assessment & Plan:  Chief Complaint:  Chief Complaint  Patient presents with   Right Knee - Routine Post Op    R TKA (surgery date 05-26-22)   Visit Diagnoses:  1. History of total knee arthroplasty, right     Plan: Patient is a 62 year old female who presents s/p right total knee arthroplasty on 05/26/2022.  She is about 6 weeks out.  States that she is doing well and still very satisfied with how her pain is to these days compared with prior to surgery.  She is going to outpatient physical therapy 2 times a week.  Not using CPM machine any longer.  She states that pain is usually around 4-5 out of 10.  She is not ambulating with any assistive devices.  She is doing exercises consisting of about 10 minutes of stationary bike and then hamstring/calf/quadricep stretching exercises followed by strengthening exercises and Steri-Strips.  She has no issues with getting up from a low chair.  She does note that it is a little bit difficult to bleed with her right leg when she is going upstairs but no difficulty going downstairs.  She takes oxycodone on as needed about 1 every 2 days.  On exam, patient has incision that has healed well.  No evidence of infection or dehiscence.  No effusion noted.  No calf tenderness.  Negative Homans' sign.  She has excellent quad strength rated 5/5.  Able to perform straight leg raise without extensor lag.  She has range of motion after some stretching from 0 degrees extension to 125 degrees of knee flexion.  Ambulates without significant antalgia.  Plan is to continue with right knee exercises with physical therapy.  Follow-up in 6 weeks for final check.  Work note provided today to allow her to return to work for full duty starting on 07/07/2022.  Follow-Up Instructions: No follow-ups on file.    Orders:  Orders Placed This Encounter  Procedures   XR KNEE 3 VIEW RIGHT   No orders of the defined types were placed in this encounter.   Imaging: No results found.  PMFS History: Patient Active Problem List   Diagnosis Date Noted   S/P TKR (total knee replacement), right 05/26/2022   Arthritis of right knee 11/12/2021   Gastroesophageal reflux disease 03/17/2021   Chronic pain of right knee 12/13/2019   Tobacco abuse 01/18/2018   Chest pain 07/11/2016   Essential hypertension 07/11/2016   Diabetes mellitus type 2 in obese 07/11/2016   Past Medical History:  Diagnosis Date   Arthritis    Diabetes mellitus without complication (HCC)    Type 2   GERD (gastroesophageal reflux disease)    High cholesterol    History of chicken pox    History of hiatal hernia    Hypertension    Obesity    Tobacco abuse     Family History  Problem Relation Age of Onset   Other Mother        valvular heart disease from Rheumatic heart disease - s/p bioprosthetic mvr/avr   Cancer Mother        Breast   CAD Maternal Grandmother        CABG   Diabetes Maternal Grandfather  Past Surgical History:  Procedure Laterality Date   ABDOMINAL HYSTERECTOMY  2009   APPENDECTOMY  2006   CHOLECYSTECTOMY  2006   COLONOSCOPY  2021   Removed fatty tumor from back     TOTAL KNEE ARTHROPLASTY Right 05/26/2022   Procedure: RIGHT TOTAL KNEE ARTHROPLASTY;  Surgeon: Cammy Copa, MD;  Location: [facility] OR;  Service: Orthopedics;  Laterality: Right;   Social History   Occupational History   Not on file  Tobacco Use   Smoking status: Former    Packs/day: 1.00    Years: 40.00    Additional pack years: 0.00    Total pack years: 40.00    Types: Cigarettes    Quit date: 06/2021    Years since quitting: 1.0   Smokeless tobacco: Never   Tobacco comments:    smoked 1.5 ppd for ~ 40 yrs - recently cut down to 1/2 ppd.  Vaping Use   Vaping Use: Never used  Substance and Sexual Activity    Alcohol use: Yes    Comment: occ   Drug use: No   Sexual activity: Yes    Partners: Male

## 2022-08-01 ENCOUNTER — Other Ambulatory Visit (HOSPITAL_BASED_OUTPATIENT_CLINIC_OR_DEPARTMENT_OTHER): Payer: Self-pay

## 2022-08-01 ENCOUNTER — Other Ambulatory Visit: Payer: Self-pay

## 2022-08-04 ENCOUNTER — Other Ambulatory Visit (HOSPITAL_BASED_OUTPATIENT_CLINIC_OR_DEPARTMENT_OTHER): Payer: Self-pay

## 2022-08-08 IMAGING — DX DG FOOT COMPLETE 3+V*L*
3 series · 3 of 3 positions shown · non-contrast
Comparison: None.

CLINICAL DATA: Left foot injury

EXAM:
LEFT FOOT - COMPLETE 3+ VIEW

[foot ap]
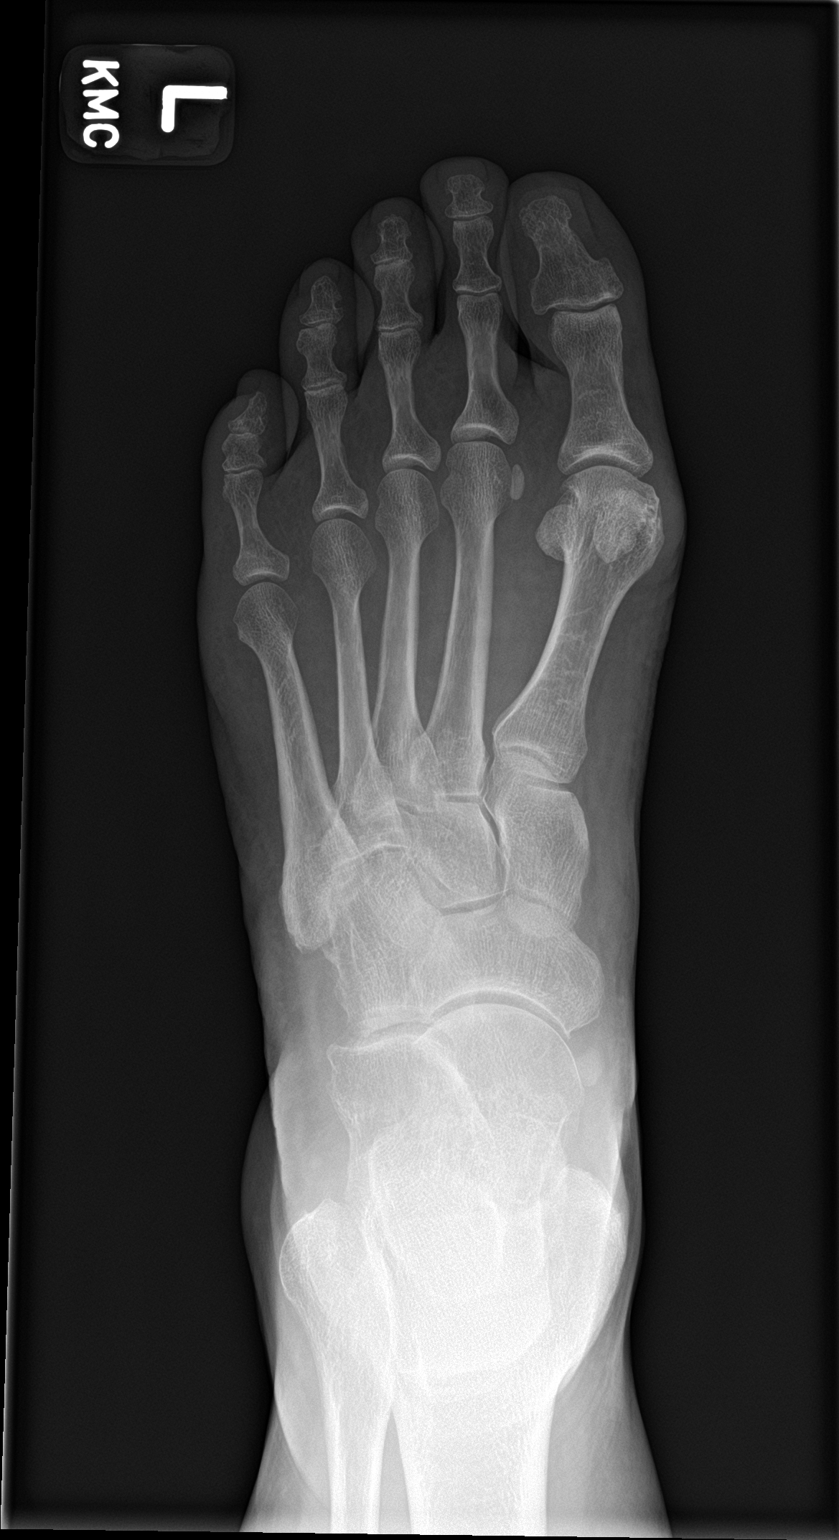

[foot obl]
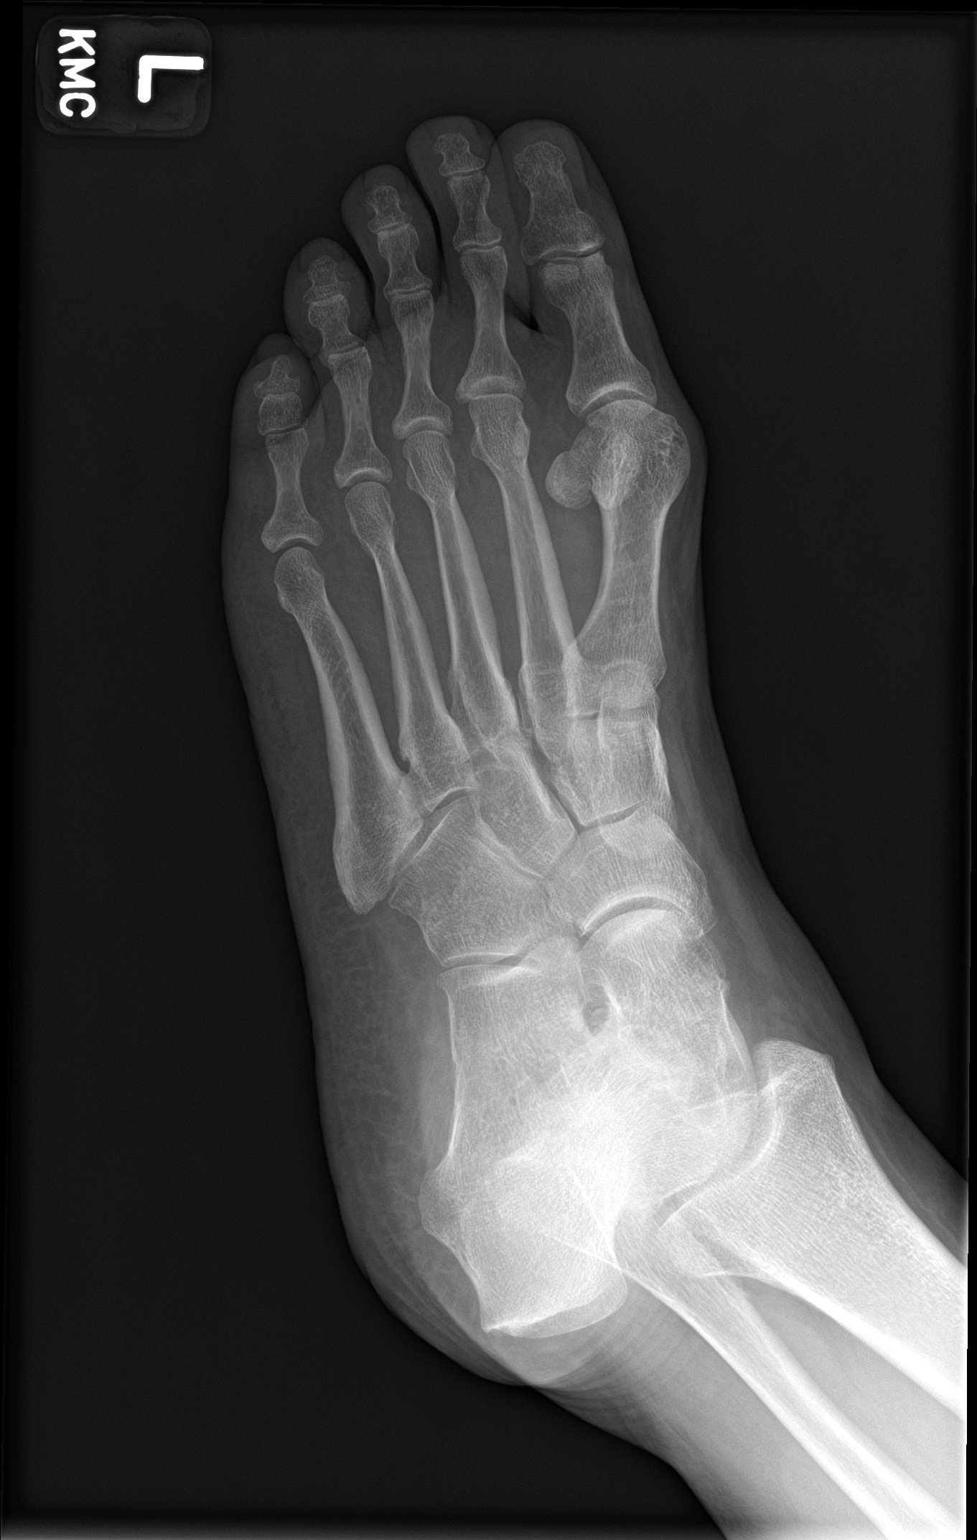

[foot lat]
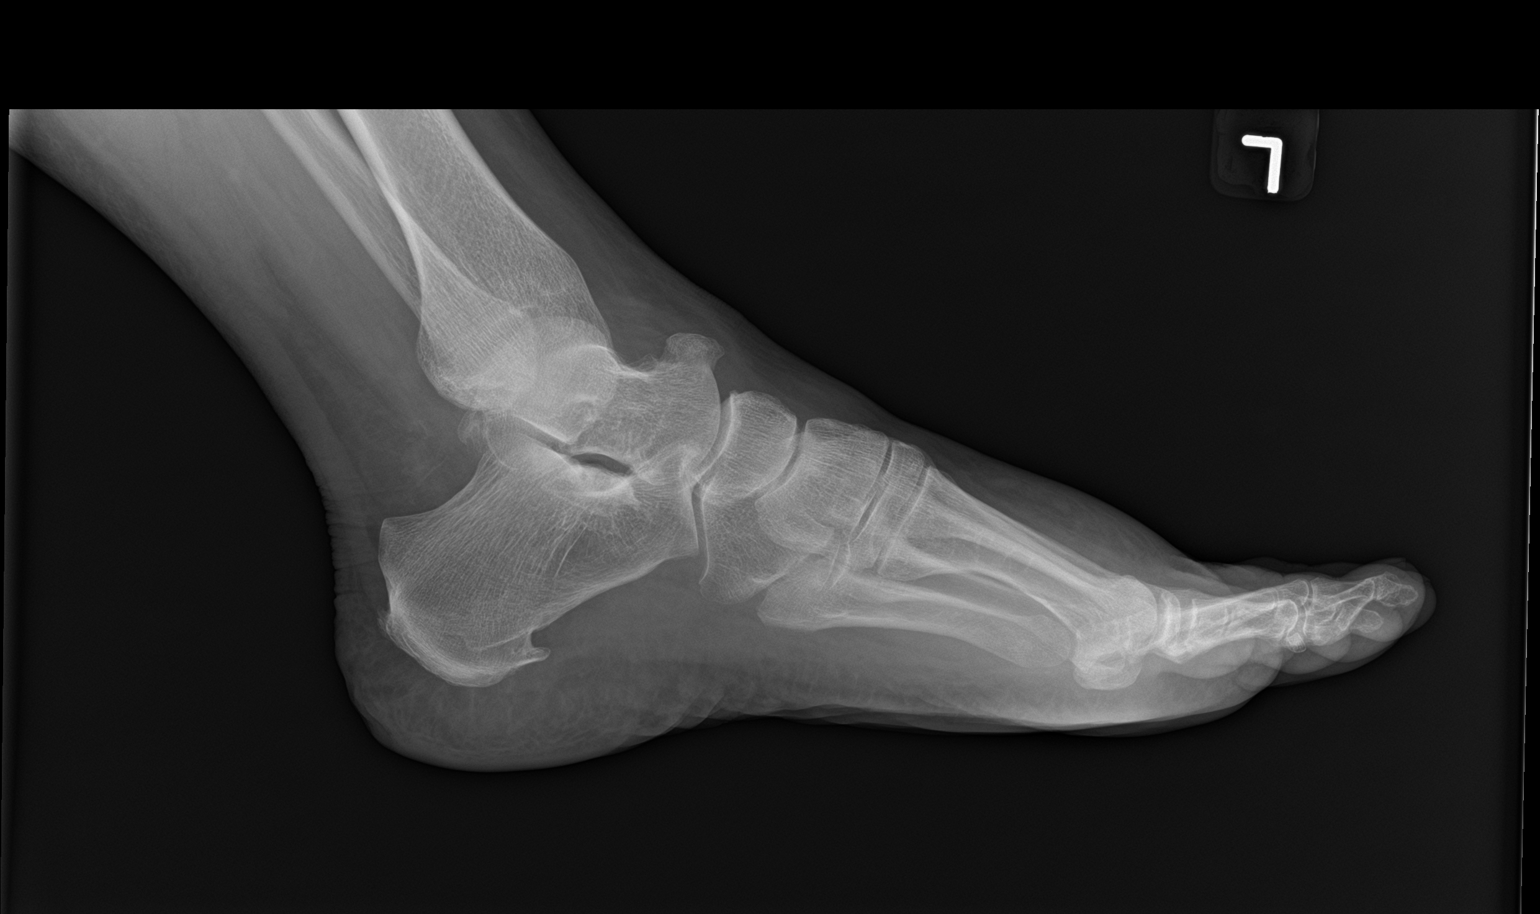

[3 of 3 positions shown; findings below may reference images not displayed]

FINDINGS: There is no evidence of fracture or dislocation. There is no
evidence of arthropathy or other focal bone abnormality. Dorsal soft
tissue swelling is noted. Calcaneal enthesophyte seen on the plantar
surface. There is an osseous protrudes on the dorsal surface of the
talus.
IMPRESSION: No acute osseous abnormality

## 2022-08-17 ENCOUNTER — Ambulatory Visit (INDEPENDENT_AMBULATORY_CARE_PROVIDER_SITE_OTHER): Payer: BC Managed Care – PPO | Admitting: Surgical

## 2022-08-17 DIAGNOSIS — Z96651 Presence of right artificial knee joint: Secondary | ICD-10-CM

## 2022-08-18 ENCOUNTER — Encounter: Payer: Self-pay | Admitting: Surgical

## 2022-08-18 NOTE — Progress Notes (Signed)
Post-Op Visit Note   Patient: Patricia Shelton           Date of Birth: 03-25-1960           MRN: [id number] Visit Date: 08/17/2022 PCP: Sharlene Dory, DO   Assessment & Plan:  Chief Complaint:  Chief Complaint  Patient presents with   Right Knee - Routine Post Op   Visit Diagnoses:  1. History of total knee arthroplasty, right     Plan: Patient is a 62 year old female who presents s/p right total knee arthroplasty on 05/26/2022.  She states that she feels she is doing great.  She finished physical therapy 2 weeks ago.  She reached 139 degrees of flexion in physical therapy when they measured her.  She reports no major complaints aside from very occasional soreness on the medial aspect of the knee but aside from that, she is very pleased.  She is able to do stairs without difficulty.  She has no difficulty getting up from a low chair.  She has about 1 mile walking endurance.  She is able to swim which is mostly treading water and doggy paddle with no swimming laps in a FreeStyle or butterfly manner anything like this.  On exam, patient has 0 degrees extension and over 130 degrees of knee flexion.  Incision is well-healed.  No effusion.  No calf tenderness.  Negative Homans' sign.  Excellent quad strength rated 5/5.  Plan is for patient to continue with home exercise program and follow-up with the office as needed.  Dental antibiotic prophylaxis was counseled today.  Follow-Up Instructions: No follow-ups on file.   Orders:  No orders of the defined types were placed in this encounter.  No orders of the defined types were placed in this encounter.   Imaging: No results found.  PMFS History: Patient Active Problem List   Diagnosis Date Noted   S/P TKR (total knee replacement), right 05/26/2022   Arthritis of right knee 11/12/2021   Gastroesophageal reflux disease 03/17/2021   Chronic pain of right knee 12/13/2019   Tobacco abuse 01/18/2018   Chest pain 07/11/2016    Essential hypertension 07/11/2016   Diabetes mellitus type 2 in obese 07/11/2016   Past Medical History:  Diagnosis Date   Arthritis    Diabetes mellitus without complication (HCC)    Type 2   GERD (gastroesophageal reflux disease)    High cholesterol    History of chicken pox    History of hiatal hernia    Hypertension    Obesity    Tobacco abuse     Family History  Problem Relation Age of Onset   Other Mother        valvular heart disease from Rheumatic heart disease - s/p bioprosthetic mvr/avr   Cancer Mother        Breast   CAD Maternal Grandmother        CABG   Diabetes Maternal Grandfather     Past Surgical History:  Procedure Laterality Date   ABDOMINAL HYSTERECTOMY  2009   APPENDECTOMY  2006   CHOLECYSTECTOMY  2006   COLONOSCOPY  2021   Removed fatty tumor from back     TOTAL KNEE ARTHROPLASTY Right 05/26/2022   Procedure: RIGHT TOTAL KNEE ARTHROPLASTY;  Surgeon: Cammy Copa, MD;  Location: [facility] OR;  Service: Orthopedics;  Laterality: Right;   Social History   Occupational History   Not on file  Tobacco Use   Smoking status: Former  Packs/day: 1.00    Years: 40.00    Additional pack years: 0.00    Total pack years: 40.00    Types: Cigarettes    Quit date: 06/2021    Years since quitting: 1.1   Smokeless tobacco: Never   Tobacco comments:    smoked 1.5 ppd for ~ 40 yrs - recently cut down to 1/2 ppd.  Vaping Use   Vaping Use: Never used  Substance and Sexual Activity   Alcohol use: Yes    Comment: occ   Drug use: No   Sexual activity: Yes    Partners: Male

## 2022-08-29 ENCOUNTER — Other Ambulatory Visit (HOSPITAL_BASED_OUTPATIENT_CLINIC_OR_DEPARTMENT_OTHER): Payer: Self-pay

## 2022-09-06 ENCOUNTER — Encounter: Payer: Self-pay | Admitting: Family Medicine

## 2022-09-06 ENCOUNTER — Other Ambulatory Visit: Payer: Self-pay | Admitting: Family Medicine

## 2022-09-06 ENCOUNTER — Other Ambulatory Visit (HOSPITAL_BASED_OUTPATIENT_CLINIC_OR_DEPARTMENT_OTHER): Payer: Self-pay

## 2022-09-06 DIAGNOSIS — E669 Obesity, unspecified: Secondary | ICD-10-CM

## 2022-09-06 DIAGNOSIS — I10 Essential (primary) hypertension: Secondary | ICD-10-CM

## 2022-09-06 MED ORDER — AMLODIPINE BESYLATE 5 MG PO TABS
5.0000 mg | ORAL_TABLET | Freq: Every day | ORAL | 0 refills | Status: DC
Start: 2022-09-06 — End: 2022-10-04
  Filled 2022-09-06: qty 30, 30d supply, fill #0

## 2022-09-06 MED ORDER — TOUJEO SOLOSTAR 300 UNIT/ML ~~LOC~~ SOPN
18.0000 [IU] | PEN_INJECTOR | Freq: Every evening | SUBCUTANEOUS | 0 refills | Status: DC
Start: 2022-09-06 — End: 2022-11-17
  Filled 2022-09-06 – 2022-09-07 (×2): qty 4.5, 75d supply, fill #0

## 2022-09-07 ENCOUNTER — Other Ambulatory Visit (HOSPITAL_BASED_OUTPATIENT_CLINIC_OR_DEPARTMENT_OTHER): Payer: Self-pay

## 2022-09-08 ENCOUNTER — Other Ambulatory Visit (HOSPITAL_COMMUNITY): Payer: Self-pay

## 2022-09-29 ENCOUNTER — Other Ambulatory Visit: Payer: Self-pay | Admitting: Family Medicine

## 2022-09-29 DIAGNOSIS — E119 Type 2 diabetes mellitus without complications: Secondary | ICD-10-CM

## 2022-09-30 ENCOUNTER — Other Ambulatory Visit (HOSPITAL_BASED_OUTPATIENT_CLINIC_OR_DEPARTMENT_OTHER): Payer: Self-pay

## 2022-09-30 MED ORDER — TRULICITY 3 MG/0.5ML ~~LOC~~ SOAJ
3.0000 mg | SUBCUTANEOUS | 2 refills | Status: DC
Start: 2022-09-30 — End: 2022-10-25
  Filled 2022-09-30: qty 2, 28d supply, fill #0

## 2022-10-04 ENCOUNTER — Other Ambulatory Visit (HOSPITAL_BASED_OUTPATIENT_CLINIC_OR_DEPARTMENT_OTHER): Payer: Self-pay

## 2022-10-04 ENCOUNTER — Other Ambulatory Visit: Payer: Self-pay | Admitting: Family Medicine

## 2022-10-04 DIAGNOSIS — I10 Essential (primary) hypertension: Secondary | ICD-10-CM

## 2022-10-04 MED ORDER — AMLODIPINE BESYLATE 5 MG PO TABS
5.0000 mg | ORAL_TABLET | Freq: Every day | ORAL | 0 refills | Status: DC
Start: 2022-10-04 — End: 2022-10-25
  Filled 2022-10-04: qty 30, 30d supply, fill #0

## 2022-10-25 ENCOUNTER — Ambulatory Visit (INDEPENDENT_AMBULATORY_CARE_PROVIDER_SITE_OTHER): Payer: 59 | Admitting: Family Medicine

## 2022-10-25 ENCOUNTER — Other Ambulatory Visit (HOSPITAL_BASED_OUTPATIENT_CLINIC_OR_DEPARTMENT_OTHER): Payer: Self-pay

## 2022-10-25 ENCOUNTER — Encounter: Payer: Self-pay | Admitting: Family Medicine

## 2022-10-25 VITALS — BP 148/94 | HR 96 | Temp 98.6°F | Ht 66.5 in | Wt 222.2 lb

## 2022-10-25 DIAGNOSIS — E1169 Type 2 diabetes mellitus with other specified complication: Secondary | ICD-10-CM

## 2022-10-25 DIAGNOSIS — E669 Obesity, unspecified: Secondary | ICD-10-CM

## 2022-10-25 DIAGNOSIS — Z6835 Body mass index (BMI) 35.0-35.9, adult: Secondary | ICD-10-CM

## 2022-10-25 DIAGNOSIS — I10 Essential (primary) hypertension: Secondary | ICD-10-CM

## 2022-10-25 DIAGNOSIS — Z7984 Long term (current) use of oral hypoglycemic drugs: Secondary | ICD-10-CM | POA: Diagnosis not present

## 2022-10-25 DIAGNOSIS — Z23 Encounter for immunization: Secondary | ICD-10-CM | POA: Diagnosis not present

## 2022-10-25 DIAGNOSIS — Z7985 Long-term (current) use of injectable non-insulin antidiabetic drugs: Secondary | ICD-10-CM | POA: Diagnosis not present

## 2022-10-25 LAB — LIPID PANEL
Cholesterol: 195 mg/dL (ref 0–200)
HDL: 48.6 mg/dL (ref >39.00)
LDL Cholesterol: 120 mg/dL — ABNORMAL HIGH (ref 0–99)
NonHDL: 146.11
Total CHOL/HDL Ratio: 4
Triglycerides: 130 mg/dL (ref 0.0–149.0)
VLDL: 26 mg/dL (ref 0.0–40.0)

## 2022-10-25 LAB — COMPREHENSIVE METABOLIC PANEL
ALT: 12 U/L (ref 0–35)
AST: 16 U/L (ref 0–37)
Albumin: 3.8 g/dL (ref 3.5–5.2)
Alkaline Phosphatase: 140 U/L — ABNORMAL HIGH (ref 39–117)
BUN: 9 mg/dL (ref 6–23)
CO2: 29 mEq/L (ref 19–32)
Calcium: 9.3 mg/dL (ref 8.4–10.5)
Chloride: 103 mEq/L (ref 96–112)
Creatinine, Ser: 0.89 mg/dL (ref 0.40–1.20)
GFR: 68.94 mL/min (ref >60.00)
Glucose, Bld: 124 mg/dL — ABNORMAL HIGH (ref 70–99)
Potassium: 4.5 mEq/L (ref 3.5–5.1)
Sodium: 141 mEq/L (ref 135–145)
Total Bilirubin: 0.5 mg/dL (ref 0.2–1.2)
Total Protein: 6.2 g/dL (ref 6.0–8.3)

## 2022-10-25 LAB — HEMOGLOBIN A1C: Hgb A1c MFr Bld: 7.2 % — ABNORMAL HIGH (ref 4.6–6.5)

## 2022-10-25 MED ORDER — HYDROCHLOROTHIAZIDE 25 MG PO TABS
25.0000 mg | ORAL_TABLET | Freq: Every day | ORAL | 2 refills | Status: DC
  Filled 2022-10-25: qty 90, 90d supply, fill #0
  Filled 2023-01-30: qty 90, 90d supply, fill #1
  Filled 2023-07-04: qty 90, 90d supply, fill #2

## 2022-10-25 MED ORDER — TIRZEPATIDE 10 MG/0.5ML ~~LOC~~ SOAJ
10.0000 mg | SUBCUTANEOUS | 0 refills | Status: DC
Start: 2022-11-22 — End: 2022-11-21
  Filled 2022-10-25: qty 2, 28d supply, fill #0

## 2022-10-25 MED ORDER — TIRZEPATIDE 15 MG/0.5ML ~~LOC~~ SOAJ
15.0000 mg | SUBCUTANEOUS | 1 refills | Status: DC
Start: 2023-01-17 — End: 2023-04-04
  Filled 2022-10-25 – 2023-01-19 (×2): qty 6, 84d supply, fill #0
  Filled 2023-02-13: qty 6, 84d supply, fill #1

## 2022-10-25 MED ORDER — TIRZEPATIDE 12.5 MG/0.5ML ~~LOC~~ SOAJ
12.5000 mg | SUBCUTANEOUS | 0 refills | Status: AC
Start: 2022-12-20 — End: 2023-01-17
  Filled 2022-10-25 – 2022-12-20 (×2): qty 2, 28d supply, fill #0

## 2022-10-25 MED ORDER — AMLODIPINE BESYLATE 5 MG PO TABS
5.0000 mg | ORAL_TABLET | Freq: Every day | ORAL | 2 refills | Status: DC
Start: 2022-10-25 — End: 2023-09-25
  Filled 2022-10-25: qty 90, 90d supply, fill #0
  Filled 2023-02-13: qty 90, 90d supply, fill #1
  Filled 2023-07-04: qty 90, 90d supply, fill #2

## 2022-10-25 MED ORDER — TIRZEPATIDE 7.5 MG/0.5ML ~~LOC~~ SOPN
7.5000 mg | PEN_INJECTOR | SUBCUTANEOUS | 0 refills | Status: AC
Start: 2022-10-25 — End: 2022-11-22
  Filled 2022-10-25: qty 2, 28d supply, fill #0

## 2022-10-25 NOTE — Progress Notes (Signed)
Subjective:   Chief Complaint  Patient presents with   Follow-up    Diabetes     Patricia Shelton is a 62 y.o. female here for follow-up of diabetes.   [name] self monitored glucose range is low 100's.  Patient denies hypoglycemic reactions. She checks her glucose levels intermittently.  Patient does require insulin.   Medications include: Metformin XR 1000 mg bid, Trulicity 3 mg/week.  Diet is good.  Exercise: cycling  Hypertension Patient presents for hypertension follow up. She does monitor home blood pressures. Blood pressures ranging on average from 120's/80's. She is compliant with medications- hydrochlorothiazide 25 mg/d, Norvasc 5 mg/d, lisinopril 40 mg/d. Patient has these side effects of medication: none Diet/exercise as above. No CP or SOB.   Past Medical History:  Diagnosis Date   Arthritis    Diabetes mellitus without complication (HCC)    Type 2   GERD (gastroesophageal reflux disease)    High cholesterol    History of chicken pox    History of hiatal hernia    Hypertension    Obesity    Tobacco abuse      Related testing: Retinal exam: Done Pneumovax: done  Objective:  BP (!) 148/94 (BP Location: Left Arm, Cuff Size: Normal)   Pulse 96   Temp 98.6 F (37 C) (Oral)   Ht 5' 6.5" (1.689 m)   Wt 222 lb 4 oz (100.8 kg)   SpO2 95%   BMI 35.33 kg/m  General:  Well developed, well nourished, in no apparent distress Lungs:  CTAB, no access msc use Cardio:  RRR, no bruits, no LE edema Psych: Age appropriate judgment and insight  Assessment:   Type 2 diabetes mellitus with obesity (HCC) - Plan: tirzepatide (MOUNJARO) 7.5 MG/0.5ML Pen, tirzepatide (MOUNJARO) 10 MG/0.5ML Pen, tirzepatide (MOUNJARO) 12.5 MG/0.5ML Pen, tirzepatide (MOUNJARO) 15 MG/0.5ML Pen, Comprehensive metabolic panel, Lipid panel, Hemoglobin A1c  Essential hypertension - Plan: hydrochlorothiazide (HYDRODIURIL) 25 MG tablet, amLODipine (NORVASC) 5 MG tablet  Need for Tdap vaccination -  Plan: Tdap vaccine greater than or equal to 7yo IM   Plan:   Chronic, hopefully stable. Counseled on diet and exercise. Will change Trulicity to Mounjaro 7.5 mg/week. Up-titrate monthly until 15 mg/week. Cont Metformin XR 1000 mg bid.  Chronic, unstable. Add back hydrochlorothiazide 25 mg/d to Norvasc 5 mg/d, lisinopril 40 mg/d.  Tdap today.  F/u in 6 mo. The patient voiced understanding and agreement to the plan.  [name], DO 10/25/22 8:28 AM

## 2022-10-25 NOTE — Patient Instructions (Addendum)
Give [place] 2-3 business days to get the results of your labs back.   Keep the diet clean and stay active.  Check your blood pressures 2-3 times per week, alternating the time of day you check it. If it is high, considering waiting 1-2 minutes and rechecking. If it gets higher, your anxiety is likely creeping up and we should avoid rechecking. If it doesn't come down back to normal levels, let us know.   I recommend getting the flu shot in mid October. This suggestion would change if the CDC comes out with a different recommendation.   Let us know if you need anything.

## 2022-11-02 ENCOUNTER — Encounter: Payer: Self-pay | Admitting: Family Medicine

## 2022-11-17 ENCOUNTER — Other Ambulatory Visit (INDEPENDENT_AMBULATORY_CARE_PROVIDER_SITE_OTHER): Payer: BC Managed Care – PPO | Admitting: Family Medicine

## 2022-11-17 ENCOUNTER — Encounter: Payer: Self-pay | Admitting: Family Medicine

## 2022-11-17 ENCOUNTER — Other Ambulatory Visit (HOSPITAL_BASED_OUTPATIENT_CLINIC_OR_DEPARTMENT_OTHER): Payer: Self-pay

## 2022-11-17 ENCOUNTER — Other Ambulatory Visit: Payer: Self-pay

## 2022-11-17 ENCOUNTER — Other Ambulatory Visit: Payer: Self-pay | Admitting: Family Medicine

## 2022-11-17 DIAGNOSIS — N3000 Acute cystitis without hematuria: Secondary | ICD-10-CM

## 2022-11-17 DIAGNOSIS — E1169 Type 2 diabetes mellitus with other specified complication: Secondary | ICD-10-CM

## 2022-11-17 MED ORDER — TOUJEO SOLOSTAR 300 UNIT/ML ~~LOC~~ SOPN
18.0000 [IU] | PEN_INJECTOR | Freq: Every evening | SUBCUTANEOUS | 3 refills | Status: DC
  Filled 2022-11-17: qty 4.5, 75d supply, fill #0
  Filled 2023-02-13: qty 4.5, 75d supply, fill #1
  Filled 2023-05-04: qty 4.5, 75d supply, fill #2
  Filled 2023-09-25: qty 4.5, 75d supply, fill #3

## 2022-11-17 MED ORDER — NITROFURANTOIN MONOHYD MACRO 100 MG PO CAPS
100.0000 mg | ORAL_CAPSULE | Freq: Two times a day (BID) | ORAL | 0 refills | Status: AC
  Filled 2022-11-17 (×2): qty 10, 5d supply, fill #0

## 2022-11-17 NOTE — Telephone Encounter (Signed)

## 2022-11-21 ENCOUNTER — Other Ambulatory Visit (HOSPITAL_BASED_OUTPATIENT_CLINIC_OR_DEPARTMENT_OTHER): Payer: Self-pay

## 2022-11-21 ENCOUNTER — Other Ambulatory Visit: Payer: Self-pay | Admitting: Family Medicine

## 2022-11-21 DIAGNOSIS — E1169 Type 2 diabetes mellitus with other specified complication: Secondary | ICD-10-CM

## 2022-11-21 MED ORDER — TIRZEPATIDE 10 MG/0.5ML ~~LOC~~ SOPN
10.0000 mg | PEN_INJECTOR | SUBCUTANEOUS | 0 refills | Status: AC
Start: 2022-11-22 — End: 2022-12-20
  Filled 2022-11-21 – 2022-11-22 (×2): qty 2, 28d supply, fill #0

## 2022-11-22 ENCOUNTER — Other Ambulatory Visit (HOSPITAL_BASED_OUTPATIENT_CLINIC_OR_DEPARTMENT_OTHER): Payer: Self-pay

## 2022-11-25 ENCOUNTER — Other Ambulatory Visit: Payer: Self-pay | Admitting: Family Medicine

## 2022-11-25 ENCOUNTER — Other Ambulatory Visit (HOSPITAL_BASED_OUTPATIENT_CLINIC_OR_DEPARTMENT_OTHER): Payer: Self-pay

## 2022-11-25 DIAGNOSIS — I10 Essential (primary) hypertension: Secondary | ICD-10-CM

## 2022-11-25 MED ORDER — LISINOPRIL 40 MG PO TABS
40.0000 mg | ORAL_TABLET | Freq: Every day | ORAL | 1 refills | Status: DC
Start: 2022-11-25 — End: 2022-12-20

## 2022-12-20 ENCOUNTER — Other Ambulatory Visit: Payer: Self-pay | Admitting: Family Medicine

## 2022-12-20 ENCOUNTER — Other Ambulatory Visit (HOSPITAL_BASED_OUTPATIENT_CLINIC_OR_DEPARTMENT_OTHER): Payer: Self-pay

## 2022-12-20 DIAGNOSIS — I10 Essential (primary) hypertension: Secondary | ICD-10-CM

## 2022-12-20 MED ORDER — LISINOPRIL 40 MG PO TABS
40.0000 mg | ORAL_TABLET | Freq: Every day | ORAL | 1 refills | Status: DC
Start: 2022-12-20 — End: 2023-07-04
  Filled 2022-12-20: qty 90, 90d supply, fill #0
  Filled 2023-03-23: qty 90, 90d supply, fill #1

## 2022-12-28 ENCOUNTER — Other Ambulatory Visit: Payer: Self-pay

## 2022-12-28 ENCOUNTER — Other Ambulatory Visit (HOSPITAL_BASED_OUTPATIENT_CLINIC_OR_DEPARTMENT_OTHER): Payer: Self-pay

## 2022-12-28 ENCOUNTER — Emergency Department (HOSPITAL_BASED_OUTPATIENT_CLINIC_OR_DEPARTMENT_OTHER)
Admission: EM | Admit: 2022-12-28 | Discharge: 2022-12-28 | Disposition: A | Discharge status: Home / Self Care | Payer: 59 | Attending: Emergency Medicine | Admitting: Emergency Medicine

## 2022-12-28 DIAGNOSIS — N3001 Acute cystitis with hematuria: Secondary | ICD-10-CM | POA: Insufficient documentation

## 2022-12-28 DIAGNOSIS — I10 Essential (primary) hypertension: Secondary | ICD-10-CM | POA: Insufficient documentation

## 2022-12-28 DIAGNOSIS — E119 Type 2 diabetes mellitus without complications: Secondary | ICD-10-CM | POA: Diagnosis not present

## 2022-12-28 DIAGNOSIS — R110 Nausea: Secondary | ICD-10-CM | POA: Diagnosis not present

## 2022-12-28 DIAGNOSIS — D72829 Elevated white blood cell count, unspecified: Secondary | ICD-10-CM | POA: Diagnosis not present

## 2022-12-28 DIAGNOSIS — Z7982 Long term (current) use of aspirin: Secondary | ICD-10-CM | POA: Diagnosis not present

## 2022-12-28 DIAGNOSIS — Z79899 Other long term (current) drug therapy: Secondary | ICD-10-CM | POA: Diagnosis not present

## 2022-12-28 DIAGNOSIS — Z7984 Long term (current) use of oral hypoglycemic drugs: Secondary | ICD-10-CM | POA: Diagnosis not present

## 2022-12-28 DIAGNOSIS — Z794 Long term (current) use of insulin: Secondary | ICD-10-CM | POA: Diagnosis not present

## 2022-12-28 DIAGNOSIS — R000 Tachycardia, unspecified: Secondary | ICD-10-CM | POA: Diagnosis not present

## 2022-12-28 DIAGNOSIS — R300 Dysuria: Secondary | ICD-10-CM | POA: Diagnosis present

## 2022-12-28 LAB — URINALYSIS, ROUTINE W REFLEX MICROSCOPIC
Glucose, UA: NEGATIVE mg/dL
Ketones, ur: 15 mg/dL — AB
Nitrite: NEGATIVE
Protein, ur: 100 mg/dL — AB
Specific Gravity, Urine: 1.025 (ref 1.005–1.030)
pH: 5.5 (ref 5.0–8.0)

## 2022-12-28 LAB — URINALYSIS, MICROSCOPIC (REFLEX): WBC, UA: >50 WBC/hpf (ref 0–5)

## 2022-12-28 MED ORDER — ONDANSETRON 4 MG PO TBDP
4.0000 mg | ORAL_TABLET | Freq: Once | ORAL | Status: AC
  Administered 2022-12-28: 4 mg via ORAL
  Filled 2022-12-28: qty 1

## 2022-12-28 MED ORDER — ONDANSETRON HCL 4 MG PO TABS
4.0000 mg | ORAL_TABLET | Freq: Four times a day (QID) | ORAL | 0 refills | Status: DC | PRN
  Filled 2022-12-28: qty 18, 5d supply, fill #0

## 2022-12-28 MED ORDER — CEFADROXIL 500 MG PO CAPS
500.0000 mg | ORAL_CAPSULE | Freq: Two times a day (BID) | ORAL | 0 refills | Status: DC
  Filled 2022-12-28: qty 14, 7d supply, fill #0

## 2022-12-28 MED ORDER — ACETAMINOPHEN 325 MG PO TABS
650.0000 mg | ORAL_TABLET | Freq: Once | ORAL | Status: AC
Start: 2022-12-28 — End: 2022-12-28
  Administered 2022-12-28: 650 mg via ORAL
  Filled 2022-12-28: qty 2

## 2022-12-28 NOTE — ED Provider Notes (Signed)
[place] [facility] DEPARTMENT AT [facility] Provider Note   CSN: [id number] Arrival date & time: 12/28/22  1431     History  Chief Complaint  Patient presents with   Dysuria    Patricia Shelton is a 62 y.o. female with past medical history significant for hypertension, diabetes, tobacco abuse who presents with concern for painful urination for 2 days.  Patient reports that when it started it was some burning with urination and frequency but is worsened today.  She denies any flank pain, fever, chills, she reports slight worsening of her chronic nausea secondary to [facility].  No previous history of kidney stones.   Dysuria      Home Medications Prior to Admission medications   Medication Sig Start Date End Date Taking? Authorizing Provider  cefadroxil (DURICEF) 500 MG capsule Take 1 capsule (500 mg total) by mouth 2 (two) times daily. 12/28/22  Yes Chyane Greer H, PA-C  ondansetron (ZOFRAN) 4 MG tablet Take 1 tablet (4 mg total) by mouth every 6 (six) hours as needed for nausea or vomiting. 12/28/22  Yes Adrina Armijo H, PA-C  amLODipine (NORVASC) 5 MG tablet Take 1 tablet (5 mg total) by mouth daily. 10/25/22   Sharlene Dory, DO  aspirin 81 MG chewable tablet Chew 1 tablet (81 mg total) by mouth 2 (two) times daily. 05/27/22   Magnant, Joycie Peek, PA-C  Blood Glucose Calibration (ACCU-CHEK AVIVA) SOLN Use for glucometer calibration 09/23/19   Wendling, Jilda Roche, DO  Blood Glucose Monitoring Suppl (ACCU-CHEK GUIDE ME) w/Device KIT Use to check blood sugar twice a day. DX 09/24/19   Sharlene Dory, DO  glucose blood (ACCU-CHEK GUIDE) test strip Use as instructed to check blood sugar twice a day.   DX  E11.9 09/24/19   Sharlene Dory, DO  hydrochlorothiazide (HYDRODIURIL) 25 MG tablet Take 1 tablet (25 mg total) by mouth daily. 10/25/22   Sharlene Dory, DO  insulin glargine, 1 Unit Dial, (TOUJEO SOLOSTAR) 300 UNIT/ML Solostar  Pen Inject 18 Units into the skin at bedtime. 11/17/22   Sharlene Dory, DO  Insulin Pen Needle (BD PEN NEEDLE NANO 2ND GEN) 32G X 4 MM MISC Use daily with Toujeo 06/07/22   Wendling, Jilda Roche, DO  Lancets Misc. (ACCU-CHEK SOFTCLIX LANCET DEV) KIT Check blood sugars twice daily 09/23/19   Sharlene Dory, DO  lisinopril (ZESTRIL) 40 MG tablet Take 1 tablet (40 mg total) by mouth daily. 12/20/22   Sharlene Dory, DO  metFORMIN (GLUCOPHAGE-XR) 500 MG 24 hr tablet Take 2 tablets (1,000 mg total) by mouth 2 (two) times daily with a meal. 04/15/22   Wendling, Jilda Roche, DO  pantoprazole (PROTONIX) 40 MG tablet Take 1 tablet (40 mg total) by mouth daily. 03/16/22   Sharlene Dory, DO  pravastatin (PRAVACHOL) 40 MG tablet Take 1 tablet (40 mg total) by mouth daily. 03/14/22   Sharlene Dory, DO  tirzepatide [facility]) 12.5 MG/0.5ML Pen Inject 12.5 mg into the skin once a week for 28 days. 12/20/22 01/17/23  Sharlene Dory, DO  tirzepatide [facility]) 15 MG/0.5ML Pen Inject 15 mg into the skin once a week. 01/17/23   Sharlene Dory, DO      Allergies    Sulfa antibiotics    Review of Systems   Review of Systems  Genitourinary:  Positive for dysuria.  All other systems reviewed and are negative.   Physical Exam Updated Vital Signs BP 117/62 (BP Location: Left  Arm)   Pulse (!) 108   Temp 98.3 F (36.8 C)   Resp 18   Ht 5' 6.75" (1.695 m)   Wt 93 kg   SpO2 99%   BMI 32.35 kg/m  Physical Exam Vitals and nursing note reviewed.  Constitutional:      General: She is not in acute distress.    Appearance: Normal appearance.  HENT:     Head: Normocephalic and atraumatic.  Eyes:     General:        Right eye: No discharge.        Left eye: No discharge.  Cardiovascular:     Rate and Rhythm: Normal rate and regular rhythm.     Heart sounds: No murmur heard.    No friction rub. No gallop.  Pulmonary:     Effort: Pulmonary  effort is normal.     Breath sounds: Normal breath sounds.  Abdominal:     General: Bowel sounds are normal.     Palpations: Abdomen is soft.     Comments: Some mild tenderness to palpation suprapubically, no rebound rigidity, guarding  Skin:    General: Skin is warm and dry.     Capillary Refill: Capillary refill takes less than 2 seconds.  Neurological:     Mental Status: She is alert and oriented to person, place, and time.  Psychiatric:        Mood and Affect: Mood normal.        Behavior: Behavior normal.     ED Results / Procedures / Treatments   Labs (all labs ordered are listed, but only abnormal results are displayed) Labs Reviewed  URINALYSIS, ROUTINE W REFLEX MICROSCOPIC - Abnormal; Notable for the following components:      Result Value   APPearance HAZY (*)    Hgb urine dipstick MODERATE (*)    Bilirubin Urine SMALL (*)    Ketones, ur 15 (*)    Protein, ur 100 (*)    Leukocytes,Ua SMALL (*)    All other components within normal limits  URINALYSIS, MICROSCOPIC (REFLEX) - Abnormal; Notable for the following components:   Bacteria, UA MANY (*)    All other components within normal limits  URINE CULTURE    EKG None  Radiology No results found.  Procedures Procedures    Medications Ordered in ED Medications  ondansetron (ZOFRAN-ODT) disintegrating tablet 4 mg (4 mg Oral Given 12/28/22 1517)  acetaminophen (TYLENOL) tablet 650 mg (650 mg Oral Given 12/28/22 1517)    ED Course/ Medical Decision Making/ A&P                                 Medical Decision Making Amount and/or Complexity of Data Reviewed Labs: ordered.   This patient is a 62 y.o. female who presents to the ED for concern of dysuria.   Differential diagnoses prior to evaluation: UTI, STI, pyelonephritis, nephrolithiasis, interstitial cystitis, versus other  Past Medical History / Social History / Additional history: Chart reviewed. Pertinent results include: hypertension,  diabetes, tobacco abuse.  Endorses allergy to sulfa antibiotics.  Physical Exam: Physical exam performed. The pertinent findings include: Mild tachycardia on arrival, pulse 108, likely secondary to pain, resolved on reevaluation, pulse 92 on my exam.  She has some mild tenderness palpation suprapubically with no rebound, rigidity, guarding.  No flank pain bilaterally.  Vital signs otherwise stable, no fever.  I independently interpreted urinalysis which shows leukocytes, ketones,  moderate hemoglobin, many bacteria and greater than 50 white blood cells, consistent with UTI even with some squamous cell contamination, high clinical suspicion for cystitis given her clinical symptoms.  Sent for culture as patient reports frequent UTIs for the last 6 months, and sometimes does not have full resolution of symptoms with antibiotic she has been placed on in the past.  Medications / Treatment: Zofran administered in the emergency department as well as Tylenol, will discharge with cefadroxil for UTI, Zofran for nausea   Disposition: After consideration of the diagnostic results and the patients response to treatment, I feel that patient is stable for discharge, findings consistent with acute cystitis.   emergency department workup does not suggest an emergent condition requiring admission or immediate intervention beyond what has been performed at this time. The plan is: as above. The patient is safe for discharge and has been instructed to return immediately for worsening symptoms, change in symptoms or any other concerns.  Final Clinical Impression(s) / ED Diagnoses Final diagnoses:  Acute cystitis with hematuria    Rx / DC Orders ED Discharge Orders          Ordered    cefadroxil (DURICEF) 500 MG capsule  2 times daily        12/28/22 1522    ondansetron (ZOFRAN) 4 MG tablet  Every 6 hours PRN        12/28/22 1522              Xochilt Conant, [place] H, PA-C 12/28/22 1523    Lonell Grandchild, MD 12/29/22 [phone]

## 2022-12-28 NOTE — Discharge Instructions (Signed)
Please take the entire course of antibiotics that I prescribed, you can use the nausea medication as needed, as we discussed I sent your urine for a culture, and so if it is not susceptible to the antibiotic that we have prescribed you should receive a call from our pharmacist.  If you do not receive a call then the antibiotics that you are prescribed should be sufficient.  Please return if your symptoms worsen or fail to improve despite treatment.

## 2022-12-28 NOTE — ED Triage Notes (Signed)
C/O painful urination x 2 days.

## 2022-12-30 LAB — URINE CULTURE: Culture: >=100000 — AB

## 2022-12-31 ENCOUNTER — Telehealth (HOSPITAL_BASED_OUTPATIENT_CLINIC_OR_DEPARTMENT_OTHER): Payer: Self-pay | Admitting: *Deleted

## 2022-12-31 NOTE — Telephone Encounter (Signed)
Post ED Visit - Positive Culture Follow-up  Culture report reviewed by antimicrobial stewardship pharmacist: [facility] []  Enzo Bi, Pharm.D. []  Celedonio Miyamoto, Pharm.D., BCPS AQ-ID []  Garvin Fila, Pharm.D., BCPS []  Georgina Pillion, [address].D., BCPS []  [place], [address].D., BCPS, AAHIVP []  Estella Husk, Pharm.D., BCPS, AAHIVP []  Lysle Pearl, PharmD, BCPS []  Phillips Climes, PharmD, BCPS []  Agapito Games, PharmD, BCPS []  Verlan Friends, PharmD []  Mervyn Gay, PharmD, BCPS [x]  Ivery Quale, PharmD  [facility] []  Len Childs, PharmD []  Greer Pickerel, PharmD []  Adalberto Cole, PharmD []  Perlie Gold, Rph []  Lonell Face) Jean Rosenthal, PharmD []  Earl Many, PharmD []  Junita Push, PharmD []  Dorna Leitz, PharmD []  Terrilee Files, PharmD []  Lynann Beaver, PharmD []  Keturah Barre, PharmD []  Loralee Pacas, PharmD []  Bernadene Person, PharmD   Positive urine culture Treated with Cefadroxil, organism sensitive to the same and no further patient follow-up is required at this time.  Patsey Berthold 12/31/2022, 9:22 AM

## 2023-01-06 ENCOUNTER — Ambulatory Visit (INDEPENDENT_AMBULATORY_CARE_PROVIDER_SITE_OTHER): Payer: 59 | Admitting: Family Medicine

## 2023-01-06 ENCOUNTER — Other Ambulatory Visit (HOSPITAL_BASED_OUTPATIENT_CLINIC_OR_DEPARTMENT_OTHER): Payer: Self-pay

## 2023-01-06 ENCOUNTER — Encounter: Payer: Self-pay | Admitting: Family Medicine

## 2023-01-06 VITALS — BP 126/80 | HR 94 | Temp 98.0°F | Resp 16 | Ht 66.0 in | Wt 212.0 lb

## 2023-01-06 DIAGNOSIS — N390 Urinary tract infection, site not specified: Secondary | ICD-10-CM | POA: Diagnosis not present

## 2023-01-06 DIAGNOSIS — R0781 Pleurodynia: Secondary | ICD-10-CM

## 2023-01-06 LAB — POC URINALSYSI DIPSTICK (AUTOMATED)
Bilirubin, UA: NEGATIVE
Blood, UA: NEGATIVE
Glucose, UA: NEGATIVE
Ketones, UA: NEGATIVE
Leukocytes, UA: NEGATIVE
Nitrite, UA: NEGATIVE
Protein, UA: NEGATIVE
Spec Grav, UA: <=1.005 — AB (ref 1.010–1.025)
Urobilinogen, UA: 0.2 U/dL
pH, UA: 6.5 (ref 5.0–8.0)

## 2023-01-06 MED ORDER — BD PEN NEEDLE NANO 2ND GEN 32G X 4 MM MISC
2 refills | Status: DC
  Filled 2023-01-06: qty 100, 90d supply, fill #0
  Filled 2023-05-10: qty 100, 90d supply, fill #1
  Filled 2023-09-12: qty 100, 90d supply, fill #2

## 2023-01-06 MED ORDER — ONDANSETRON HCL 4 MG PO TABS
4.0000 mg | ORAL_TABLET | Freq: Four times a day (QID) | ORAL | 0 refills | Status: AC | PRN
  Filled 2023-01-06: qty 21, 6d supply, fill #0

## 2023-01-06 NOTE — Patient Instructions (Addendum)
Send me a message if this returns and we will renew a prescription and place a referral to the urology team.   Ice/cold pack over area for 10-15 min twice daily.  OK to take Tylenol 1000 mg (2 extra strength tabs) or 975 mg (3 regular strength tabs) every 6 hours as needed.  Use the incentive spirometer hourly while awake.   Let us know if you need anything.

## 2023-01-06 NOTE — Progress Notes (Signed)
Chief Complaint  Patient presents with   Follow-up    Follow up on uti    Patricia Shelton is a 62 y.o. female here for possible UTI.  Duration: 2 week. Symptoms: No current s/s's Denies: urinary frequency, hematuria, urinary hesitancy, urinary retention, fever, nausea, vomiting, urgency, vaginal discharge Hx of recurrent UTI? No Tx'd w cefadroxil on 12/28/22 in ED.  Denies new sexual partners.  5 days ago the patient was at a football game and fell over the seats.  She landed on her right side and is having difficulty taking deep breaths.  Certain movements are painful as well.  She denies any bruising or swelling over the area.  She does have bruising over her left lower extremity but that is not as bothersome to her.  Is been taking Tylenol and oxycodone at home.  No fevers or cough.  Past Medical History:  Diagnosis Date   Arthritis    Diabetes mellitus without complication (HCC)    Type 2   GERD (gastroesophageal reflux disease)    High cholesterol    History of chicken pox    History of hiatal hernia    Hypertension    Obesity    Tobacco abuse      BP 126/80 (BP Location: Right Arm, Patient Position: Sitting, Cuff Size: Normal)   Pulse 94   Temp 98 F (36.7 C) (Oral)   Resp 16   Ht 5\' 6"  (1.676 m)   Wt 212 lb (96.2 kg)   SpO2 96%   BMI 34.22 kg/m  General: Awake, alert, appears stated age Heart: RRR Lungs: CTAB, normal respiratory effort, no accessory muscle usage Abd: BS+, soft, NT, ND, no masses or organomegaly MSK: TTP over the lateral lower rib cage around rib 10 and 11.  There is no crepitus, erythema, ecchymosis, edema, or deformity. MSK: No CVA tenderness, neg Lloyd's sign Psych: Age appropriate judgment and insight  Recurrent UTI - Plan: Urine Culture, POCT Urinalysis Dipstick (Automated)  Rib pain on right side  Stay hydrated. Seek immediate care if pt starts to develop fevers, new/worsening symptoms, uncontrollable N/V. If this recurs again, she will  let us know and we will refer to urology, consider ordering 7 d of abx.  Ice, heat, Tylenol. Discussed how this is unlikely to be a significant fx where management changes. She has an IS at home that she will start using.  F/u prn. The patient voiced understanding and agreement to the plan.  Jilda Roche [place], DO 01/06/23 9:04 AM

## 2023-01-07 LAB — URINE CULTURE
MICRO NUMBER:: 15705822
SPECIMEN QUALITY:: ADEQUATE

## 2023-01-09 ENCOUNTER — Other Ambulatory Visit (HOSPITAL_BASED_OUTPATIENT_CLINIC_OR_DEPARTMENT_OTHER): Payer: Self-pay

## 2023-01-11 ENCOUNTER — Other Ambulatory Visit (HOSPITAL_BASED_OUTPATIENT_CLINIC_OR_DEPARTMENT_OTHER): Payer: Self-pay

## 2023-01-11 ENCOUNTER — Encounter: Payer: Self-pay | Admitting: Family Medicine

## 2023-01-11 ENCOUNTER — Other Ambulatory Visit: Payer: Self-pay | Admitting: Family Medicine

## 2023-01-11 MED ORDER — OXYCODONE HCL 5 MG PO TABS
5.0000 mg | ORAL_TABLET | Freq: Four times a day (QID) | ORAL | 0 refills | Status: AC | PRN
  Filled 2023-01-11 – 2023-01-12 (×2): qty 15, 4d supply, fill #0

## 2023-01-12 ENCOUNTER — Other Ambulatory Visit (HOSPITAL_BASED_OUTPATIENT_CLINIC_OR_DEPARTMENT_OTHER): Payer: Self-pay

## 2023-01-13 ENCOUNTER — Other Ambulatory Visit (HOSPITAL_BASED_OUTPATIENT_CLINIC_OR_DEPARTMENT_OTHER): Payer: Self-pay

## 2023-01-16 ENCOUNTER — Other Ambulatory Visit (HOSPITAL_BASED_OUTPATIENT_CLINIC_OR_DEPARTMENT_OTHER): Payer: Self-pay

## 2023-01-16 ENCOUNTER — Encounter: Payer: Self-pay | Admitting: Family Medicine

## 2023-01-16 ENCOUNTER — Other Ambulatory Visit: Payer: Self-pay | Admitting: Family Medicine

## 2023-01-16 DIAGNOSIS — N390 Urinary tract infection, site not specified: Secondary | ICD-10-CM

## 2023-01-16 MED ORDER — NITROFURANTOIN MONOHYD MACRO 100 MG PO CAPS
100.0000 mg | ORAL_CAPSULE | Freq: Two times a day (BID) | ORAL | 0 refills | Status: DC
  Filled 2023-01-16 (×2): qty 14, 7d supply, fill #0

## 2023-01-19 ENCOUNTER — Other Ambulatory Visit (HOSPITAL_COMMUNITY): Payer: Self-pay

## 2023-01-19 ENCOUNTER — Other Ambulatory Visit (HOSPITAL_BASED_OUTPATIENT_CLINIC_OR_DEPARTMENT_OTHER): Payer: Self-pay

## 2023-01-19 ENCOUNTER — Telehealth: Payer: Self-pay

## 2023-01-19 NOTE — Telephone Encounter (Signed)
Pharmacy Patient Advocate Encounter   Received notification from Patient Advice Request messages that prior authorization for Mounjaro 15MG /0.5ML auto-injectors is required/requested.   Insurance verification completed.   The patient is insured through [employer] [facility] .   Per test claim: PA required; PA submitted to above mentioned insurance via CoverMyMeds Key/confirmation #/EOC [id number] Status is pending

## 2023-01-20 ENCOUNTER — Other Ambulatory Visit (HOSPITAL_COMMUNITY): Payer: Self-pay

## 2023-01-20 NOTE — Telephone Encounter (Signed)
Pharmacy Patient Advocate Encounter  Received notification from [employer] that Prior Authorization for  [facility] 15MG /0.5ML auto-injectors  has been APPROVED from 01/19/23 to 01/18/26. Unable to obtain price due to refill too soon rejection, last fill date 01/19/23 next available fill date12/16/24   PA #/Case ID/Reference #: [id number]

## 2023-01-20 NOTE — Telephone Encounter (Signed)
Sent pt message let her know PA approved.

## 2023-01-23 ENCOUNTER — Ambulatory Visit (INDEPENDENT_AMBULATORY_CARE_PROVIDER_SITE_OTHER): Payer: 59 | Admitting: Urology

## 2023-01-23 ENCOUNTER — Encounter: Payer: Self-pay | Admitting: Urology

## 2023-01-23 ENCOUNTER — Other Ambulatory Visit (HOSPITAL_BASED_OUTPATIENT_CLINIC_OR_DEPARTMENT_OTHER): Payer: Self-pay

## 2023-01-23 VITALS — BP 164/97 | HR 101 | Ht 66.75 in | Wt 200.0 lb

## 2023-01-23 DIAGNOSIS — Z09 Encounter for follow-up examination after completed treatment for conditions other than malignant neoplasm: Secondary | ICD-10-CM

## 2023-01-23 DIAGNOSIS — N390 Urinary tract infection, site not specified: Secondary | ICD-10-CM | POA: Insufficient documentation

## 2023-01-23 DIAGNOSIS — Z87440 Personal history of urinary (tract) infections: Secondary | ICD-10-CM

## 2023-01-23 LAB — URINALYSIS, ROUTINE W REFLEX MICROSCOPIC
Bilirubin, UA: NEGATIVE
Glucose, UA: NEGATIVE
Ketones, UA: NEGATIVE
Leukocytes,UA: NEGATIVE
Nitrite, UA: NEGATIVE
Protein,UA: NEGATIVE
RBC, UA: NEGATIVE
Specific Gravity, UA: 1.015 (ref 1.005–1.030)
Urobilinogen, Ur: 0.2 mg/dL (ref 0.2–1.0)
pH, UA: 7 (ref 5.0–7.5)

## 2023-01-23 LAB — BLADDER SCAN AMB NON-IMAGING

## 2023-01-23 MED ORDER — ESTRADIOL 0.1 MG/GM VA CREA
TOPICAL_CREAM | VAGINAL | 12 refills | Status: AC
  Filled 2023-01-23: qty 42.5, 30d supply, fill #0
  Filled 2023-07-04: qty 42.5, 30d supply, fill #1

## 2023-01-23 NOTE — Progress Notes (Signed)
Assessment: 1. Frequent UTI     Plan: I personally reviewed the patient's chart including provider notes, and lab results. Unfortunately, all of her urine culture results are not available for review today. Methods to reduce the risk of UTIs discussed with the patient including increase fluid intake, timed and double voiding, daily cranberry supplement, daily probiotic, and use of vaginal hormone cream. Request all available urine cultures from [facility] urgent care. I discussed the importance of obtaining a urine culture prior to initiation of treatment for UTI symptoms. I also discussed the use of a daily prophylactic antibiotic for UTI prevention.  She will hold off at this time. Begin Estrace vaginal cream 2-3 times per week. Return to office in 1 month.  Chief Complaint:  Chief Complaint  Patient presents with   Frequent UTI    History of Present Illness:  Patricia Shelton is a 62 y.o. female who is seen in consultation from Sharlene Dory, DO for evaluation of frequent UTI's.  She reports 5 UTI's in past 6 months.  Prior to this, she reported only occasional UTI's.  Typical UTI symptoms include dysuria, low back pain, frequency, urgency.  She reports passing small blood clots at times.  No fever or chills.  She has been treated at Urgent Care and by Dr. Carmelia Roller.  She completed a 7 day course of Macrobid yesterday.  No current UTI symptoms.    Urine culture results: 3/24 No growth 10/24 >100K E. Coli 11/24 <10K colonies  She does not associate the UTIs with intercourse.   She is s/p a hysterectomy in 2010 for endometriosis.   She has occasional SUI and uses 1 maxipad/day.  Past Medical History:  Past Medical History:  Diagnosis Date   Arthritis    Diabetes mellitus without complication (HCC)    Type 2   GERD (gastroesophageal reflux disease)    High cholesterol    History of chicken pox    History of hiatal hernia    Hypertension    Obesity    Tobacco abuse      Past Surgical History:  Past Surgical History:  Procedure Laterality Date   ABDOMINAL HYSTERECTOMY  2009   APPENDECTOMY  2006   CHOLECYSTECTOMY  2006   COLONOSCOPY  2021   Removed fatty tumor from back     TOTAL KNEE ARTHROPLASTY Right 05/26/2022   Procedure: RIGHT TOTAL KNEE ARTHROPLASTY;  Surgeon: Cammy Copa, MD;  Location: [facility] OR;  Service: Orthopedics;  Laterality: Right;    Allergies:  Allergies  Allergen Reactions   Sulfa Antibiotics Nausea And Vomiting    Family History:  Family History  Problem Relation Age of Onset   Other Mother        valvular heart disease from Rheumatic heart disease - s/p bioprosthetic mvr/avr   Cancer Mother        Breast   CAD Maternal Grandmother        CABG   Diabetes Maternal Grandfather     Social History:  Social History   Tobacco Use   Smoking status: Former    Current packs/day: 0.00    Average packs/day: 1 pack/day for 40.0 years (40.0 ttl pk-yrs)    Types: Cigarettes    Start date: 06/1981    Quit date: 06/2021    Years since quitting: 1.5   Smokeless tobacco: Never   Tobacco comments:    smoked 1.5 ppd for ~ 40 yrs - recently cut down to 1/2 ppd.  Vaping  Use   Vaping status: Never Used  Substance Use Topics   Alcohol use: Yes    Comment: occ   Drug use: No    Review of symptoms:  Constitutional:  Negative for unexplained weight loss, night sweats, fever, chills ENT:  Negative for nose bleeds, sinus pain, painful swallowing CV:  Negative for chest pain, shortness of breath, exercise intolerance, palpitations, loss of consciousness Resp:  Negative for cough, wheezing, shortness of breath GI:  Negative for nausea, vomiting, diarrhea, bloody stools GU:  Positives noted in HPI; otherwise negative for gross hematuria Neuro:  Negative for seizures, poor balance, limb weakness, slurred speech Psych:  Negative for lack of energy, depression, anxiety Endocrine:  Negative for polydipsia, polyuria, symptoms of  hypoglycemia (dizziness, hunger, sweating) Hematologic:  Negative for anemia, purpura, petechia, prolonged or excessive bleeding, use of anticoagulants  Allergic:  Negative for difficulty breathing or choking as a result of exposure to anything; no shellfish allergy; no allergic response (rash/itch) to materials, foods  Physical exam: BP (!) 164/97   Pulse (!) 101   Ht 5' 6.75" (1.695 m)   Wt 200 lb (90.7 kg)   BMI 31.56 kg/m  GENERAL APPEARANCE:  Well appearing, well developed, well nourished, NAD HEENT: Atraumatic, Normocephalic, oropharynx clear. NECK: Supple without lymphadenopathy or thyromegaly. LUNGS: Clear to auscultation bilaterally. HEART: Regular Rate and Rhythm without murmurs, gallops, or rubs. ABDOMEN: Soft, non-tender, No Masses. EXTREMITIES: Moves all extremities well.  Without clubbing, cyanosis, or edema. NEUROLOGIC:  Alert and oriented x 3, normal gait, CN II-XII grossly intact.  MENTAL STATUS:  Appropriate. BACK:  Non-tender to palpation.  No CVAT SKIN:  Warm, dry and intact.    Results: U/A: negative  PVR: 130 ml

## 2023-02-13 ENCOUNTER — Other Ambulatory Visit (HOSPITAL_BASED_OUTPATIENT_CLINIC_OR_DEPARTMENT_OTHER): Payer: Self-pay

## 2023-02-14 ENCOUNTER — Other Ambulatory Visit: Payer: Self-pay

## 2023-02-15 ENCOUNTER — Other Ambulatory Visit (HOSPITAL_BASED_OUTPATIENT_CLINIC_OR_DEPARTMENT_OTHER): Payer: Self-pay

## 2023-02-27 ENCOUNTER — Encounter: Payer: Self-pay | Admitting: Urology

## 2023-02-27 ENCOUNTER — Ambulatory Visit (INDEPENDENT_AMBULATORY_CARE_PROVIDER_SITE_OTHER): Payer: 59 | Admitting: Urology

## 2023-02-27 VITALS — BP 158/90 | HR 106 | Ht 67.0 in | Wt 194.0 lb

## 2023-02-27 DIAGNOSIS — N390 Urinary tract infection, site not specified: Secondary | ICD-10-CM

## 2023-02-27 DIAGNOSIS — Z09 Encounter for follow-up examination after completed treatment for conditions other than malignant neoplasm: Secondary | ICD-10-CM | POA: Diagnosis not present

## 2023-02-27 DIAGNOSIS — Z87440 Personal history of urinary (tract) infections: Secondary | ICD-10-CM | POA: Diagnosis not present

## 2023-02-27 LAB — URINALYSIS, ROUTINE W REFLEX MICROSCOPIC
Bilirubin, UA: NEGATIVE
Glucose, UA: NEGATIVE
Ketones, UA: NEGATIVE
Leukocytes,UA: NEGATIVE
Nitrite, UA: NEGATIVE
Protein,UA: NEGATIVE
RBC, UA: NEGATIVE
Specific Gravity, UA: 1.02 (ref 1.005–1.030)
Urobilinogen, Ur: 0.2 mg/dL (ref 0.2–1.0)
pH, UA: 6.5 (ref 5.0–7.5)

## 2023-02-27 NOTE — Progress Notes (Signed)
Assessment: 1. Frequent UTI     Plan: Urine cultures from PCP reviewed. Continue methods to reduce the risk of UTIs including increase fluid intake, timed and double voiding, daily cranberry supplement, daily probiotic, and use of vaginal hormone cream. I discussed the importance of obtaining a urine culture prior to initiation of treatment for UTI symptoms. Continue Estrace vaginal cream 2-3 times per week. Return to office in 2 months.  Chief Complaint:  Chief Complaint  Patient presents with   Frequent UTI    History of Present Illness:  Patricia Shelton is a 62 y.o. female who is seen for further evaluation of frequent UTI's.  At her initial visit in 11/24, she reported 5 UTI's in past 6 months.  Prior to this, she reported only occasional UTI's.  Typical UTI symptoms include dysuria, low back pain, frequency, urgency.  She reported passing small blood clots at times.  No fever or chills.  She has been treated at Urgent Care and by Dr. Carmelia Roller.  She completed a 7 day course of Macrobid prior to her visit.  No UTI symptoms.    Urine culture results: 3/24 No growth 3/24 >100K E. coli 7/24 E. coli 10/24 >100K E. Coli 11/24 <10K colonies  She does not associate the UTIs with intercourse.   She is s/p a hysterectomy in 2010 for endometriosis.  She reports undergoing repair of a ureteral injury at that time. She has occasional SUI and uses 1 maxipad/day.  She is today for follow-up.  She is using the Estrace cream 2-3 times per week.  She has not had any recent UTI symptoms.  She has not been on any antibiotics for the past month.  No dysuria or gross hematuria.  She continues on a daily cranberry supplement and probiotic.  Portions of the above documentation were copied from a prior visit for review purposes only.   Past Medical History:  Past Medical History:  Diagnosis Date   Arthritis    Diabetes mellitus without complication (HCC)    Type 2   GERD (gastroesophageal  reflux disease)    High cholesterol    History of chicken pox    History of hiatal hernia    Hypertension    Obesity    Tobacco abuse     Past Surgical History:  Past Surgical History:  Procedure Laterality Date   ABDOMINAL HYSTERECTOMY  2009   APPENDECTOMY  2006   CHOLECYSTECTOMY  2006   COLONOSCOPY  2021   Removed fatty tumor from back     TOTAL KNEE ARTHROPLASTY Right 05/26/2022   Procedure: RIGHT TOTAL KNEE ARTHROPLASTY;  Surgeon: Cammy Copa, MD;  Location: [facility] OR;  Service: Orthopedics;  Laterality: Right;    Allergies:  Allergies  Allergen Reactions   Sulfa Antibiotics Nausea And Vomiting    Family History:  Family History  Problem Relation Age of Onset   Other Mother        valvular heart disease from Rheumatic heart disease - s/p bioprosthetic mvr/avr   Cancer Mother        Breast   CAD Maternal Grandmother        CABG   Diabetes Maternal Grandfather     Social History:  Social History   Tobacco Use   Smoking status: Former    Current packs/day: 0.00    Average packs/day: 1 pack/day for 40.0 years (40.0 ttl pk-yrs)    Types: Cigarettes    Start date: 06/1981    Quit date:  06/2021    Years since quitting: 1.6   Smokeless tobacco: Never   Tobacco comments:    smoked 1.5 ppd for ~ 40 yrs - recently cut down to 1/2 ppd.  Vaping Use   Vaping status: Never Used  Substance Use Topics   Alcohol use: Yes    Comment: occ   Drug use: No    ROS: Constitutional:  Negative for fever, chills, weight loss CV: Negative for chest pain, previous MI, hypertension Respiratory:  Negative for shortness of breath, wheezing, sleep apnea, frequent cough GI:  Negative for nausea, vomiting, bloody stool, GERD   Physical exam: BP (!) 158/90   Pulse (!) 106   Ht 5\' 7"  (1.702 m)   Wt 194 lb (88 kg)   BMI 30.38 kg/m  GENERAL APPEARANCE:  Well appearing, well developed, well nourished, NAD HEENT:  Atraumatic, normocephalic, oropharynx clear NECK:  Supple  without lymphadenopathy or thyromegaly ABDOMEN:  Soft, non-tender, no masses EXTREMITIES:  Moves all extremities well, without clubbing, cyanosis, or edema NEUROLOGIC:  Alert and oriented x 3, normal gait, CN II-XII grossly intact MENTAL STATUS:  appropriate BACK:  Non-tender to palpation, No CVAT SKIN:  Warm, dry, and intact  Results: U/A: negative

## 2023-03-09 ENCOUNTER — Other Ambulatory Visit (HOSPITAL_BASED_OUTPATIENT_CLINIC_OR_DEPARTMENT_OTHER): Payer: Self-pay

## 2023-03-23 ENCOUNTER — Other Ambulatory Visit: Payer: Self-pay | Admitting: Family Medicine

## 2023-03-23 ENCOUNTER — Other Ambulatory Visit (HOSPITAL_BASED_OUTPATIENT_CLINIC_OR_DEPARTMENT_OTHER): Payer: Self-pay

## 2023-03-23 ENCOUNTER — Other Ambulatory Visit: Payer: Self-pay

## 2023-03-23 DIAGNOSIS — K219 Gastro-esophageal reflux disease without esophagitis: Secondary | ICD-10-CM

## 2023-03-23 MED ORDER — PANTOPRAZOLE SODIUM 40 MG PO TBEC
40.0000 mg | DELAYED_RELEASE_TABLET | Freq: Every day | ORAL | 3 refills | Status: AC
  Filled 2023-03-23: qty 90, 90d supply, fill #0
  Filled 2023-07-04: qty 90, 90d supply, fill #1
  Filled 2023-10-12: qty 90, 90d supply, fill #2
  Filled 2024-01-16: qty 90, 90d supply, fill #3

## 2023-04-04 ENCOUNTER — Other Ambulatory Visit: Payer: Self-pay | Admitting: Family

## 2023-04-04 ENCOUNTER — Encounter: Payer: Self-pay | Admitting: Family Medicine

## 2023-04-04 MED ORDER — TIRZEPATIDE 10 MG/0.5ML ~~LOC~~ SOAJ: 10.0000 mg | SUBCUTANEOUS | 1 refills | Status: DC

## 2023-04-13 ENCOUNTER — Other Ambulatory Visit (HOSPITAL_BASED_OUTPATIENT_CLINIC_OR_DEPARTMENT_OTHER): Payer: Self-pay

## 2023-05-04 ENCOUNTER — Other Ambulatory Visit: Payer: Self-pay | Admitting: Urology

## 2023-05-04 ENCOUNTER — Other Ambulatory Visit: Payer: Self-pay

## 2023-05-04 ENCOUNTER — Ambulatory Visit: Payer: Self-pay | Admitting: Urology

## 2023-05-04 DIAGNOSIS — N390 Urinary tract infection, site not specified: Secondary | ICD-10-CM

## 2023-05-04 LAB — MICROSCOPIC EXAMINATION

## 2023-05-04 LAB — URINALYSIS, ROUTINE W REFLEX MICROSCOPIC
Bilirubin, UA: NEGATIVE
Glucose, UA: NEGATIVE
Ketones, UA: NEGATIVE
Nitrite, UA: NEGATIVE
Protein,UA: NEGATIVE
Specific Gravity, UA: 1.01 (ref 1.005–1.030)
Urobilinogen, Ur: 0.2 mg/dL (ref 0.2–1.0)
pH, UA: 6 (ref 5.0–7.5)

## 2023-05-04 MED ORDER — NITROFURANTOIN MONOHYD MACRO 100 MG PO CAPS: 100.0000 mg | ORAL_CAPSULE | Freq: Two times a day (BID) | ORAL | 0 refills | Status: DC

## 2023-05-04 NOTE — Addendum Note (Signed)
 Addended by: Carolin Coy on: 05/04/2023 08:58 AM   Modules accepted: Orders

## 2023-05-06 ENCOUNTER — Encounter (HOSPITAL_BASED_OUTPATIENT_CLINIC_OR_DEPARTMENT_OTHER): Payer: Self-pay | Admitting: Emergency Medicine

## 2023-05-06 ENCOUNTER — Ambulatory Visit (HOSPITAL_BASED_OUTPATIENT_CLINIC_OR_DEPARTMENT_OTHER)
Admission: EM | Admit: 2023-05-06 | Discharge: 2023-05-06 | Disposition: A | Discharge status: Home / Self Care | Attending: Family Medicine | Admitting: Family Medicine

## 2023-05-06 DIAGNOSIS — R300 Dysuria: Secondary | ICD-10-CM | POA: Diagnosis present

## 2023-05-06 DIAGNOSIS — N3001 Acute cystitis with hematuria: Secondary | ICD-10-CM | POA: Insufficient documentation

## 2023-05-06 LAB — POCT URINALYSIS DIP (MANUAL ENTRY)
Bilirubin, UA: NEGATIVE
Blood, UA: NEGATIVE
Glucose, UA: NEGATIVE mg/dL
Ketones, POC UA: NEGATIVE mg/dL
Nitrite, UA: NEGATIVE
Spec Grav, UA: 1.02
Urobilinogen, UA: 1 U/dL
pH, UA: 5.5

## 2023-05-06 MED ORDER — NITROFURANTOIN MONOHYD MACRO 100 MG PO CAPS: 100.0000 mg | ORAL_CAPSULE | Freq: Two times a day (BID) | ORAL | 0 refills | Status: AC

## 2023-05-06 NOTE — ED Triage Notes (Signed)
 Patient c/o dysuria, urgency, frequency and odor x 1 week.  Patient did leave a sample w/her urologist, urine didn't show infection.  Patient denies any OTC pain meds.

## 2023-05-06 NOTE — ED Provider Notes (Signed)
 [facility]    CSN: [id number] Arrival date & time: 05/06/23  1146      History   Chief Complaint Chief Complaint  Patient presents with   Dysuria    HPI Patricia Shelton is a 63 y.o. female.   Patient c/o dysuria, urgency, frequency and odor x 1 week (since 04/29/23).  Patient did leave a urine sample w/her urologist on Thursday, 05/04/23, but she never heard back from them.  Patient denies any OTC pain meds.   Dysuria Associated symptoms: no abdominal pain, no fever, no nausea and no vomiting     Past Medical History:  Diagnosis Date   Arthritis    Diabetes mellitus without complication (HCC)    Type 2   GERD (gastroesophageal reflux disease)    High cholesterol    History of chicken pox    History of hiatal hernia    Hypertension    Obesity    Tobacco abuse     Patient Active Problem List   Diagnosis Date Noted   Frequent UTI 01/23/2023   S/P TKR (total knee replacement), right 05/26/2022   Arthritis of right knee 11/12/2021   Gastroesophageal reflux disease 03/17/2021   Chronic pain of right knee 12/13/2019   Tobacco abuse 01/18/2018   Chest pain 07/11/2016   Essential hypertension 07/11/2016   Type 2 diabetes mellitus with obesity (HCC) 07/11/2016    Past Surgical History:  Procedure Laterality Date   ABDOMINAL HYSTERECTOMY  2009   APPENDECTOMY  2006   CHOLECYSTECTOMY  2006   COLONOSCOPY  2021   Removed fatty tumor from back     TOTAL KNEE ARTHROPLASTY Right 05/26/2022   Procedure: RIGHT TOTAL KNEE ARTHROPLASTY;  Surgeon: Cammy Copa, MD;  Location: [facility] OR;  Service: Orthopedics;  Laterality: Right;    OB History   No obstetric history on file.      Home Medications    Prior to Admission medications   Medication Sig Start Date End Date Taking? Authorizing Provider  amLODipine (NORVASC) 5 MG tablet Take 1 tablet (5 mg total) by mouth daily. 10/25/22  Yes Sharlene Dory, DO  aspirin 81 MG chewable tablet Chew 1 tablet (81 mg  total) by mouth 2 (two) times daily. 05/27/22  Yes Magnant, Joycie Peek, PA-C  Blood Glucose Calibration (ACCU-CHEK AVIVA) SOLN Use for glucometer calibration 09/23/19  Yes Wendling, Jilda Roche, DO  Blood Glucose Monitoring Suppl (ACCU-CHEK GUIDE ME) w/Device KIT Use to check blood sugar twice a day. DX 09/24/19  Yes Sharlene Dory, DO  estradiol (ESTRACE) 0.1 MG/GM vaginal cream Apply 1 gram vaginally 2-3 times per week 01/23/23  Yes Stoneking, Danford Bad., MD  glucose blood (ACCU-CHEK GUIDE) test strip Use as instructed to check blood sugar twice a day.   DX  E11.9 09/24/19  Yes Sharlene Dory, DO  hydrochlorothiazide (HYDRODIURIL) 25 MG tablet Take 1 tablet (25 mg total) by mouth daily. 10/25/22  Yes Wendling, Jilda Roche, DO  insulin glargine, 1 Unit Dial, (TOUJEO SOLOSTAR) 300 UNIT/ML Solostar Pen Inject 18 Units into the skin at bedtime. 11/17/22  Yes Wendling, Jilda Roche, DO  Insulin Pen Needle (BD PEN NEEDLE NANO 2ND GEN) 32G X 4 MM MISC Use daily with Toujeo 01/06/23  Yes Wendling, Jilda Roche, DO  Lancets Misc. (ACCU-CHEK SOFTCLIX LANCET DEV) KIT Check blood sugars twice daily 09/23/19  Yes Wendling, Jilda Roche, DO  lisinopril (ZESTRIL) 40 MG tablet Take 1 tablet (40 mg total) by mouth daily. 12/20/22  Yes Sharlene Dory, DO  metFORMIN (GLUCOPHAGE-XR) 500 MG 24 hr tablet Take 2 tablets (1,000 mg total) by mouth 2 (two) times daily with a meal. 04/15/22  Yes Wendling, Jilda Roche, DO  ondansetron (ZOFRAN) 4 MG tablet Take 1 tablet (4 mg total) by mouth every 6 (six) hours as needed for nausea or vomiting. 01/06/23  Yes Sharlene Dory, DO  pantoprazole (PROTONIX) 40 MG tablet Take 1 tablet (40 mg total) by mouth daily. 03/23/23  Yes Sharlene Dory, DO  pravastatin (PRAVACHOL) 40 MG tablet Take 1 tablet (40 mg total) by mouth daily. 03/14/22  Yes Wendling, Jilda Roche, DO  tirzepatide [facility]) 10 MG/0.5ML Pen Inject 10 mg into the skin once a  week. 04/04/23  Yes Worthy Rancher B, FNP  nitrofurantoin, macrocrystal-monohydrate, (MACROBID) 100 MG capsule Take 1 capsule (100 mg total) by mouth 2 (two) times daily for 7 days. 05/06/23 05/13/23  Prescilla Sours, FNP  oxyCODONE (OXY IR/ROXICODONE) 5 MG immediate release tablet Take 1 tablet (5 mg total) by mouth every 6 (six) hours as needed for severe pain (pain score 7-10). 01/11/23   Sharlene Dory, DO    Family History Family History  Problem Relation Age of Onset   Other Mother        valvular heart disease from Rheumatic heart disease - s/p bioprosthetic mvr/avr   Cancer Mother        Breast   CAD Maternal Grandmother        CABG   Diabetes Maternal Grandfather     Social History Social History   Tobacco Use   Smoking status: Former    Current packs/day: 0.00    Average packs/day: 1 pack/day for 40.0 years (40.0 ttl pk-yrs)    Types: Cigarettes    Start date: 06/1981    Quit date: 06/2021    Years since quitting: 1.8   Smokeless tobacco: Never   Tobacco comments:    smoked 1.5 ppd for ~ 40 yrs - recently cut down to 1/2 ppd.  Vaping Use   Vaping status: Never Used  Substance Use Topics   Alcohol use: Yes    Comment: occ   Drug use: No     Allergies   Sulfa antibiotics   Review of Systems Review of Systems  Constitutional:  Negative for chills and fever.  HENT:  Negative for ear pain and sore throat.   Eyes:  Negative for pain and visual disturbance.  Respiratory:  Negative for cough and shortness of breath.   Cardiovascular:  Negative for chest pain and palpitations.  Gastrointestinal:  Negative for abdominal pain, constipation, diarrhea, nausea and vomiting.  Genitourinary:  Positive for dysuria, frequency, pelvic pain and urgency. Negative for hematuria.  Musculoskeletal:  Negative for arthralgias and back pain.  Skin:  Negative for color change and rash.  Neurological:  Negative for seizures and syncope.  All other systems reviewed and are  negative.    Physical Exam Triage Vital Signs ED Triage Vitals  Encounter Vitals Group     BP 05/06/23 1331 123/88     Systolic BP Percentile --      Diastolic BP Percentile --      Pulse Rate 05/06/23 1331 95     Resp 05/06/23 1331 18     Temp 05/06/23 1331 98.9 F (37.2 C)     Temp Source 05/06/23 1331 Oral     SpO2 05/06/23 1331 96 %     Weight 05/06/23 1333 185 lb (83.9 kg)  Height 05/06/23 1333 5' 6.75" (1.695 m)     Head Circumference --      Peak Flow --      Pain Score --      Pain Loc --      Pain Education --      Exclude from Growth Chart --    No data found.  Updated Vital Signs BP 123/88 (BP Location: Right Arm)   Pulse 95   Temp 98.9 F (37.2 C) (Oral)   Resp 18   Ht 5' 6.75" (1.695 m)   Wt 185 lb (83.9 kg)   SpO2 96%   BMI 29.19 kg/m   Visual Acuity Right Eye Distance:   Left Eye Distance:   Bilateral Distance:    Right Eye Near:   Left Eye Near:    Bilateral Near:     Physical Exam Vitals and nursing note reviewed.  Constitutional:      General: She is not in acute distress.    Appearance: She is well-developed. She is not ill-appearing or toxic-appearing.  HENT:     Head: Normocephalic and atraumatic.     Right Ear: Hearing, tympanic membrane, ear canal and external ear normal.     Left Ear: Hearing, tympanic membrane, ear canal and external ear normal.     Nose: No congestion or rhinorrhea.     Right Sinus: No maxillary sinus tenderness or frontal sinus tenderness.     Left Sinus: No maxillary sinus tenderness or frontal sinus tenderness.     Mouth/Throat:     Lips: Pink.     Mouth: Mucous membranes are moist.     Pharynx: Uvula midline. No oropharyngeal exudate or posterior oropharyngeal erythema.     Tonsils: No tonsillar exudate.  Eyes:     Conjunctiva/sclera: Conjunctivae normal.     Pupils: Pupils are equal, round, and reactive to light.  Cardiovascular:     Rate and Rhythm: Normal rate and regular rhythm.     Heart  sounds: S1 normal and S2 normal. No murmur heard. Pulmonary:     Effort: Pulmonary effort is normal. No respiratory distress.     Breath sounds: Normal breath sounds. No decreased breath sounds, wheezing, rhonchi or rales.  Abdominal:     General: Bowel sounds are normal.     Palpations: Abdomen is soft.     Tenderness: There is abdominal tenderness (mild) in the right lower quadrant, suprapubic area and left lower quadrant.  Musculoskeletal:        General: No swelling.     Cervical back: Neck supple.  Lymphadenopathy:     Head:     Right side of head: No submental, submandibular, tonsillar, preauricular or posterior auricular adenopathy.     Left side of head: No submental, submandibular, tonsillar, preauricular or posterior auricular adenopathy.     Cervical: No cervical adenopathy.     Right cervical: No superficial cervical adenopathy.    Left cervical: No superficial cervical adenopathy.  Skin:    General: Skin is warm and dry.     Capillary Refill: Capillary refill takes less than 2 seconds.     Findings: No rash.  Neurological:     Mental Status: She is alert and oriented to person, place, and time.  Psychiatric:        Mood and Affect: Mood normal.      UC Treatments / Results  Labs (all labs ordered are listed, but only abnormal results are displayed) Labs Reviewed  POCT URINALYSIS DIP (  MANUAL ENTRY) - Abnormal; Notable for the following components:      Result Value   Protein Ur, POC trace (*)    Leukocytes, UA Small (1+) (*)    All other components within normal limits  URINE CULTURE    EKG   Radiology No results found.  Procedures Procedures (including critical care time)  Medications Ordered in UC Medications - No data to display  Initial Impression / Assessment and Plan / UC Course  I have reviewed the triage vital signs and the nursing notes.  Pertinent labs & imaging results that were available during my care of the patient were reviewed by me  and considered in my medical decision making (see chart for details).     Urinalysis is abnormal.  Urine culture sent.  Will adjust the plan of care, once the urine culture results.  Take nitrofurantoin, 100 mg twice daily for 7 days.  Get plenty of fluids and rest.  Follow-up if symptoms do not improve, worsen or new symptoms occur. Final Clinical Impressions(s) / UC Diagnoses   Final diagnoses:  Dysuria  Acute cystitis with hematuria     Discharge Instructions      Take the nitrofurantoin, 100 mg twice daily for 7 days.  Get plenty of fluids.  Urine culture sent.  Follow-up if symptoms do not improve, worsen or new symptoms occur.     ED Prescriptions     Medication Sig Dispense Auth. Provider   nitrofurantoin, macrocrystal-monohydrate, (MACROBID) 100 MG capsule Take 1 capsule (100 mg total) by mouth 2 (two) times daily for 7 days. 14 capsule Prescilla Sours, FNP      PDMP not reviewed this encounter.   Prescilla Sours, FNP 05/06/23 [phone]

## 2023-05-06 NOTE — Discharge Instructions (Signed)
 Take the nitrofurantoin, 100 mg twice daily for 7 days.  Get plenty of fluids.  Urine culture sent.  Follow-up if symptoms do not improve, worsen or new symptoms occur.

## 2023-05-07 LAB — URINE CULTURE

## 2023-05-08 ENCOUNTER — Encounter: Payer: Self-pay | Admitting: Urology

## 2023-05-08 LAB — URINE CULTURE: Culture: 80000 — AB

## 2023-05-10 ENCOUNTER — Other Ambulatory Visit (HOSPITAL_BASED_OUTPATIENT_CLINIC_OR_DEPARTMENT_OTHER): Payer: Self-pay

## 2023-05-11 ENCOUNTER — Other Ambulatory Visit (HOSPITAL_BASED_OUTPATIENT_CLINIC_OR_DEPARTMENT_OTHER): Payer: Self-pay

## 2023-05-12 ENCOUNTER — Other Ambulatory Visit (HOSPITAL_BASED_OUTPATIENT_CLINIC_OR_DEPARTMENT_OTHER): Payer: Self-pay

## 2023-07-04 ENCOUNTER — Other Ambulatory Visit (HOSPITAL_BASED_OUTPATIENT_CLINIC_OR_DEPARTMENT_OTHER): Payer: Self-pay

## 2023-07-04 ENCOUNTER — Encounter: Payer: Self-pay | Admitting: Family Medicine

## 2023-07-04 ENCOUNTER — Other Ambulatory Visit: Payer: Self-pay | Admitting: Family Medicine

## 2023-07-04 DIAGNOSIS — I10 Essential (primary) hypertension: Secondary | ICD-10-CM

## 2023-07-04 MED ORDER — TIRZEPATIDE 10 MG/0.5ML ~~LOC~~ SOAJ: 10.0000 mg | SUBCUTANEOUS | 1 refills | Status: DC

## 2023-07-04 MED ORDER — PRAVASTATIN SODIUM 40 MG PO TABS
40.0000 mg | ORAL_TABLET | Freq: Every day | ORAL | 3 refills | Status: AC
  Filled 2023-07-04: qty 90, 90d supply, fill #0
  Filled 2023-10-25: qty 90, 90d supply, fill #1
  Filled 2024-02-07: qty 90, 90d supply, fill #2

## 2023-07-04 MED ORDER — LISINOPRIL 40 MG PO TABS
40.0000 mg | ORAL_TABLET | Freq: Every day | ORAL | 1 refills | Status: DC
  Filled 2023-07-04: qty 90, 90d supply, fill #0
  Filled 2023-10-25: qty 90, 90d supply, fill #1

## 2023-09-13 ENCOUNTER — Other Ambulatory Visit (HOSPITAL_BASED_OUTPATIENT_CLINIC_OR_DEPARTMENT_OTHER): Payer: Self-pay

## 2023-09-13 ENCOUNTER — Other Ambulatory Visit: Payer: Self-pay

## 2023-09-14 ENCOUNTER — Other Ambulatory Visit (HOSPITAL_BASED_OUTPATIENT_CLINIC_OR_DEPARTMENT_OTHER): Payer: Self-pay

## 2023-09-25 ENCOUNTER — Other Ambulatory Visit: Payer: Self-pay | Admitting: Family Medicine

## 2023-09-25 ENCOUNTER — Other Ambulatory Visit: Payer: Self-pay

## 2023-09-25 ENCOUNTER — Encounter: Payer: Self-pay | Admitting: Family Medicine

## 2023-09-25 ENCOUNTER — Other Ambulatory Visit (HOSPITAL_BASED_OUTPATIENT_CLINIC_OR_DEPARTMENT_OTHER): Payer: Self-pay

## 2023-09-25 DIAGNOSIS — I10 Essential (primary) hypertension: Secondary | ICD-10-CM

## 2023-09-25 MED ORDER — TIRZEPATIDE 10 MG/0.5ML ~~LOC~~ SOAJ
10.0000 mg | SUBCUTANEOUS | 1 refills | Status: DC
  Filled 2023-09-25: qty 6, 84d supply, fill #0
  Filled 2023-12-20: qty 6, 84d supply, fill #1

## 2023-09-25 MED ORDER — AMLODIPINE BESYLATE 5 MG PO TABS
5.0000 mg | ORAL_TABLET | Freq: Every day | ORAL | 2 refills | Status: AC
  Filled 2023-09-25: qty 90, 90d supply, fill #0
  Filled 2024-02-07: qty 90, 90d supply, fill #1

## 2023-09-25 MED ORDER — HYDROCHLOROTHIAZIDE 25 MG PO TABS
25.0000 mg | ORAL_TABLET | Freq: Every day | ORAL | 2 refills | Status: AC
  Filled 2023-09-25: qty 90, 90d supply, fill #0
  Filled 2024-02-07: qty 90, 90d supply, fill #1

## 2023-09-28 ENCOUNTER — Other Ambulatory Visit (HOSPITAL_BASED_OUTPATIENT_CLINIC_OR_DEPARTMENT_OTHER): Payer: Self-pay

## 2023-12-14 ENCOUNTER — Other Ambulatory Visit: Payer: Self-pay | Admitting: Family Medicine

## 2023-12-14 DIAGNOSIS — E119 Type 2 diabetes mellitus without complications: Secondary | ICD-10-CM

## 2023-12-20 ENCOUNTER — Other Ambulatory Visit: Payer: Self-pay

## 2023-12-20 ENCOUNTER — Other Ambulatory Visit: Payer: Self-pay | Admitting: Family Medicine

## 2023-12-20 DIAGNOSIS — E669 Obesity, unspecified: Secondary | ICD-10-CM

## 2024-01-01 ENCOUNTER — Encounter: Payer: Self-pay | Admitting: Family Medicine

## 2024-01-01 ENCOUNTER — Ambulatory Visit: Payer: Self-pay

## 2024-01-01 ENCOUNTER — Other Ambulatory Visit (HOSPITAL_BASED_OUTPATIENT_CLINIC_OR_DEPARTMENT_OTHER): Payer: Self-pay

## 2024-01-01 ENCOUNTER — Ambulatory Visit: Admitting: Family Medicine

## 2024-01-01 VITALS — BP 138/85 | HR 71 | Temp 99.2°F | Resp 15 | Ht 67.5 in | Wt 187.4 lb

## 2024-01-01 DIAGNOSIS — R1011 Right upper quadrant pain: Secondary | ICD-10-CM

## 2024-01-01 MED ORDER — DICYCLOMINE HCL 10 MG PO CAPS
10.0000 mg | ORAL_CAPSULE | Freq: Four times a day (QID) | ORAL | 0 refills | Status: AC | PRN
  Filled 2024-01-01: qty 60, 15d supply, fill #0

## 2024-01-01 MED ORDER — PROMETHAZINE HCL 12.5 MG PO TABS
12.5000 mg | ORAL_TABLET | Freq: Four times a day (QID) | ORAL | 0 refills | Status: AC | PRN
  Filled 2024-01-01: qty 30, 8d supply, fill #0

## 2024-01-01 NOTE — Patient Instructions (Signed)
 Give us  2-3 business days to get the results of your labs back.   Let me know how things are going.  Let us  know if you need anything.

## 2024-01-01 NOTE — Telephone Encounter (Signed)
 Appt scheduled

## 2024-01-01 NOTE — Telephone Encounter (Signed)
 FYI Only or Action Required?: FYI only for provider: appointment scheduled on 01/01/24.  Patient was last seen in primary care on 01/06/2023 by Frann Mabel Mt, DO.  Called Nurse Triage reporting Abdominal Pain.  Symptoms began several days ago.  Interventions attempted: Nothing.  Symptoms are: gradually worsening.  Triage Disposition: See Physician Within 24 Hours  Patient/caregiver understands and will follow disposition?: Yes  Copied from CRM #[id number]. Topic: Clinical - Red Word Triage >> Jan 01, 2024  8:02 AM Harlene ORN wrote: Red Word that prompted transfer to Nurse Triage:  right sides hurting really bad; starting Thursday last week and gotten worse. not appendix causing her difficulty breathing Reason for Disposition  Blood in urine (red, pink, or tea-colored)  Answer Assessment - Initial Assessment Questions 1. LOCATION: Where does it hurt?      In between the rib cage and where the appendix should be  2. RADIATION: Does the pain shoot anywhere else? (e.g., chest, back)     Radiates around to the back  3. ONSET: When did the pain begin? (e.g., minutes, hours or days ago)      4 days  4. SUDDEN: Gradual or sudden onset?     Woke up with the pain and it gradually got worse  5. PATTERN Does the pain come and go, or is it constant?     Comes and goes  6. SEVERITY: How bad is the pain?  (e.g., Scale 1-10; mild, moderate, or severe)     5/10  7. RECURRENT SYMPTOM: Have you ever had this type of stomach pain before? If Yes, ask: When was the last time? and What happened that time?      No has never had this type of pain before  8. CAUSE: What do you think is causing the stomach pain? (e.g., gallstones, recent abdominal surgery)     Unsure of cause.  9. RELIEVING/AGGRAVATING FACTORS: What makes it better or worse? (e.g., antacids, bending or twisting motion, bowel movement)     Moving makes the pain worse ] 10. OTHER SYMPTOMS: Do you  have any other symptoms? (e.g., back pain, diarrhea, fever, urination pain, vomiting)       Back pain and nausea and blood in urine  11. PREGNANCY: Is there any chance you are pregnant? When was your last menstrual period?       no  Protocols used: Abdominal Pain - [facility]

## 2024-01-01 NOTE — Progress Notes (Signed)
 Chief Complaint  Patient presents with   Abdominal Pain    Rlq since Thursday and has gotten worst      Subjective Patricia Shelton is a 63 y.o. female who presents with nausea and abd pain.  Symptoms began 4 d ago.  Patient has abdominal pain and nausea Patient denies vomiting, diarrhea, fever, skin changes, bleeding, trauma, urinary complaints, skin changes, and URI symptoms Treatment to date: no Sick contacts: none known  Past Medical History:  Diagnosis Date   Arthritis    Diabetes mellitus without complication (HCC)    Type 2   GERD (gastroesophageal reflux disease)    High cholesterol    History of chicken pox    History of hiatal hernia    Hypertension    Obesity    Tobacco abuse     Exam BP 138/85 (BP Location: Left Arm, Cuff Size: Normal)   Pulse 71   Temp 99.2 F (37.3 C) (Oral)   Resp 15   Ht 5' 7.5 (1.715 m)   Wt 187 lb 6.4 oz (85 kg)   SpO2 97%   BMI 28.92 kg/m  General:  well developed, well hydrated, in no apparent distress Skin:  warm, no pallor or diaphoresis, no rashes Throat/Pharynx:  MMM Lungs:  clear to auscultation, breath sounds equal bilaterally, no respiratory distress, no wheezes MSK: No bony tenderness over the lower rib cage Cardio:  RRR Abdomen:  abdomen soft, TTP in the right upper quadrant and epigastric region; bowel sounds normal; no masses or organomegaly Psych: Appropriate judgement/insight  Assessment and Plan  RUQ abdominal pain - Plan: CBC, Basic metabolic panel with GFR, Hepatic function panel, dicyclomine (BENTYL) 10 MG capsule, promethazine  (PHENERGAN ) 12.5 MG tablet  Check labs.  She does not have a gallbladder.  Does not feel like her typical reflux.  Treat as above.  Avoid aggravating foods, discussed advancing diet.  Seek immediate care if worsening. F/u if symptoms fail to improve, sooner if worsening. The patient voiced understanding and agreement to the plan.  Mabel Mt [place], DO 01/01/24  2:58 PM

## 2024-01-03 ENCOUNTER — Encounter: Payer: Self-pay | Admitting: Family Medicine

## 2024-01-03 ENCOUNTER — Other Ambulatory Visit: Payer: Self-pay

## 2024-01-03 DIAGNOSIS — R1013 Epigastric pain: Secondary | ICD-10-CM

## 2024-01-03 DIAGNOSIS — R1011 Right upper quadrant pain: Secondary | ICD-10-CM

## 2024-01-10 ENCOUNTER — Other Ambulatory Visit: Payer: Self-pay | Admitting: Family Medicine

## 2024-01-10 ENCOUNTER — Other Ambulatory Visit (HOSPITAL_BASED_OUTPATIENT_CLINIC_OR_DEPARTMENT_OTHER): Payer: Self-pay

## 2024-01-11 ENCOUNTER — Other Ambulatory Visit (HOSPITAL_BASED_OUTPATIENT_CLINIC_OR_DEPARTMENT_OTHER): Payer: Self-pay

## 2024-01-11 ENCOUNTER — Other Ambulatory Visit: Payer: Self-pay

## 2024-01-11 MED ORDER — INSUPEN PEN NEEDLES 32G X 4 MM MISC
2 refills | Status: AC
  Filled 2024-01-11: qty 100, 100d supply, fill #0

## 2024-01-12 ENCOUNTER — Other Ambulatory Visit (HOSPITAL_BASED_OUTPATIENT_CLINIC_OR_DEPARTMENT_OTHER): Payer: Self-pay

## 2024-01-15 ENCOUNTER — Other Ambulatory Visit (HOSPITAL_BASED_OUTPATIENT_CLINIC_OR_DEPARTMENT_OTHER): Payer: Self-pay

## 2024-01-19 ENCOUNTER — Other Ambulatory Visit (HOSPITAL_BASED_OUTPATIENT_CLINIC_OR_DEPARTMENT_OTHER): Payer: Self-pay

## 2024-01-26 ENCOUNTER — Other Ambulatory Visit (HOSPITAL_BASED_OUTPATIENT_CLINIC_OR_DEPARTMENT_OTHER): Payer: Self-pay

## 2024-02-07 ENCOUNTER — Other Ambulatory Visit (HOSPITAL_BASED_OUTPATIENT_CLINIC_OR_DEPARTMENT_OTHER): Payer: Self-pay

## 2024-02-07 ENCOUNTER — Other Ambulatory Visit: Payer: Self-pay

## 2024-02-07 ENCOUNTER — Other Ambulatory Visit: Payer: Self-pay | Admitting: Family Medicine

## 2024-02-07 DIAGNOSIS — E119 Type 2 diabetes mellitus without complications: Secondary | ICD-10-CM

## 2024-02-07 DIAGNOSIS — E669 Obesity, unspecified: Secondary | ICD-10-CM

## 2024-02-07 DIAGNOSIS — I10 Essential (primary) hypertension: Secondary | ICD-10-CM

## 2024-02-07 MED ORDER — LISINOPRIL 40 MG PO TABS
40.0000 mg | ORAL_TABLET | Freq: Every day | ORAL | 1 refills | Status: AC
  Filled 2024-02-07: qty 90, 90d supply, fill #0

## 2024-02-07 MED ORDER — TOUJEO SOLOSTAR 300 UNIT/ML ~~LOC~~ SOPN
18.0000 [IU] | PEN_INJECTOR | Freq: Every evening | SUBCUTANEOUS | 3 refills | Status: AC
  Filled 2024-02-07: qty 4.5, 75d supply, fill #0

## 2024-02-07 NOTE — Telephone Encounter (Signed)
 Plz sched DM visit. Thx.

## 2024-03-17 ENCOUNTER — Other Ambulatory Visit: Payer: Self-pay | Admitting: Family Medicine

## 2024-03-18 ENCOUNTER — Other Ambulatory Visit (HOSPITAL_BASED_OUTPATIENT_CLINIC_OR_DEPARTMENT_OTHER): Payer: Self-pay

## 2024-03-18 MED ORDER — MOUNJARO 10 MG/0.5ML ~~LOC~~ SOAJ
10.0000 mg | SUBCUTANEOUS | 1 refills | Status: AC
  Filled 2024-03-18: qty 6, 84d supply, fill #0
